# Patient Record
Sex: Female | Born: 1960 | Race: Black or African American | Hispanic: No | Marital: Married | Smoking: Former smoker
Health system: Southern US, Community
[De-identification: ages and names within clinical notes are randomized; demographics above are authoritative.]

## PROBLEM LIST (undated history)

## (undated) DIAGNOSIS — A159 Respiratory tuberculosis unspecified: Secondary | ICD-10-CM

## (undated) DIAGNOSIS — C50919 Malignant neoplasm of unspecified site of unspecified female breast: Secondary | ICD-10-CM

## (undated) DIAGNOSIS — R519 Headache, unspecified: Secondary | ICD-10-CM

## (undated) DIAGNOSIS — R112 Nausea with vomiting, unspecified: Secondary | ICD-10-CM

## (undated) DIAGNOSIS — Z9889 Other specified postprocedural states: Secondary | ICD-10-CM

## (undated) DIAGNOSIS — R51 Headache: Secondary | ICD-10-CM

## (undated) DIAGNOSIS — Z9221 Personal history of antineoplastic chemotherapy: Secondary | ICD-10-CM

## (undated) HISTORY — PX: AUGMENTATION MAMMAPLASTY: SUR837

## (undated) HISTORY — PX: TONSILLECTOMY: SUR1361

---

## 2011-11-23 HISTORY — PX: MASTECTOMY: SHX3

## 2015-12-05 ENCOUNTER — Emergency Department (HOSPITAL_COMMUNITY)
Admission: EM | Admit: 2015-12-05 | Discharge: 2015-12-05 | Disposition: A | Discharge status: Home / Self Care | Payer: Self-pay | Attending: Emergency Medicine | Admitting: Emergency Medicine

## 2015-12-05 ENCOUNTER — Encounter (HOSPITAL_COMMUNITY): Payer: Self-pay | Admitting: *Deleted

## 2015-12-05 DIAGNOSIS — Y9389 Activity, other specified: Secondary | ICD-10-CM | POA: Insufficient documentation

## 2015-12-05 DIAGNOSIS — X58XXXA Exposure to other specified factors, initial encounter: Secondary | ICD-10-CM | POA: Insufficient documentation

## 2015-12-05 DIAGNOSIS — Z880 Allergy status to penicillin: Secondary | ICD-10-CM | POA: Insufficient documentation

## 2015-12-05 DIAGNOSIS — Z853 Personal history of malignant neoplasm of breast: Secondary | ICD-10-CM | POA: Insufficient documentation

## 2015-12-05 DIAGNOSIS — F17210 Nicotine dependence, cigarettes, uncomplicated: Secondary | ICD-10-CM | POA: Insufficient documentation

## 2015-12-05 DIAGNOSIS — Y9289 Other specified places as the place of occurrence of the external cause: Secondary | ICD-10-CM | POA: Insufficient documentation

## 2015-12-05 DIAGNOSIS — Y658 Other specified misadventures during surgical and medical care: Secondary | ICD-10-CM | POA: Insufficient documentation

## 2015-12-05 DIAGNOSIS — T391X1A Poisoning by 4-Aminophenol derivatives, accidental (unintentional), initial encounter: Secondary | ICD-10-CM | POA: Insufficient documentation

## 2015-12-05 DIAGNOSIS — T85848A Pain due to other internal prosthetic devices, implants and grafts, initial encounter: Secondary | ICD-10-CM | POA: Insufficient documentation

## 2015-12-05 DIAGNOSIS — F419 Anxiety disorder, unspecified: Secondary | ICD-10-CM | POA: Insufficient documentation

## 2015-12-05 DIAGNOSIS — Y998 Other external cause status: Secondary | ICD-10-CM | POA: Insufficient documentation

## 2015-12-05 HISTORY — DX: Malignant neoplasm of unspecified site of unspecified female breast: C50.919

## 2015-12-05 LAB — CBC WITH DIFFERENTIAL/PLATELET
BASOS ABS: 0 10*3/uL (ref 0.0–0.1)
Basophils Relative: 0 %
Eosinophils Absolute: 0.1 10*3/uL (ref 0.0–0.7)
Eosinophils Relative: 1 %
HEMATOCRIT: 44.9 % (ref 36.0–46.0)
Hemoglobin: 14.5 g/dL (ref 12.0–15.0)
LYMPHS PCT: 22 %
Lymphs Abs: 1.8 10*3/uL (ref 0.7–4.0)
MCH: 27.7 pg (ref 26.0–34.0)
MCHC: 32.3 g/dL (ref 30.0–36.0)
MCV: 85.7 fL (ref 78.0–100.0)
Monocytes Absolute: 0.8 10*3/uL (ref 0.1–1.0)
Monocytes Relative: 10 %
NEUTROS ABS: 5.6 10*3/uL (ref 1.7–7.7)
Neutrophils Relative %: 67 %
Platelets: 198 10*3/uL (ref 150–400)
RBC: 5.24 MIL/uL — AB (ref 3.87–5.11)
RDW: 12.9 % (ref 11.5–15.5)
WBC: 8.4 10*3/uL (ref 4.0–10.5)

## 2015-12-05 LAB — COMPREHENSIVE METABOLIC PANEL
ALT: 16 U/L (ref 14–54)
AST: 22 U/L (ref 15–41)
Albumin: 4.8 g/dL (ref 3.5–5.0)
Alkaline Phosphatase: 94 U/L (ref 38–126)
Anion gap: 11 (ref 5–15)
BILIRUBIN TOTAL: 1 mg/dL (ref 0.3–1.2)
BUN: 6 mg/dL (ref 6–20)
CALCIUM: 9.4 mg/dL (ref 8.9–10.3)
CO2: 21 mmol/L — AB (ref 22–32)
Chloride: 109 mmol/L (ref 101–111)
Creatinine, Ser: 0.69 mg/dL (ref 0.44–1.00)
GLUCOSE: 76 mg/dL (ref 65–99)
POTASSIUM: 3.4 mmol/L — AB (ref 3.5–5.1)
Sodium: 141 mmol/L (ref 135–145)
Total Protein: 8.4 g/dL — ABNORMAL HIGH (ref 6.5–8.1)

## 2015-12-05 LAB — ACETAMINOPHEN LEVEL: ACETAMINOPHEN (TYLENOL), SERUM: 12 ug/mL (ref 10–30)

## 2015-12-05 LAB — ETHANOL: Alcohol, Ethyl (B): <5 mg/dL (ref <5)

## 2015-12-05 LAB — SALICYLATE LEVEL

## 2015-12-05 LAB — LIPASE, BLOOD: Lipase: 29 U/L (ref 11–51)

## 2015-12-05 MED ORDER — FENTANYL CITRATE (PF) 100 MCG/2ML IJ SOLN
50.0000 ug | Freq: Once | INTRAMUSCULAR | Status: AC
  Administered 2015-12-05: 50 ug via INTRAVENOUS
  Filled 2015-12-05: qty 2

## 2015-12-05 MED ORDER — SODIUM CHLORIDE 0.9 % IV BOLUS (SEPSIS)
1000.0000 mL | Freq: Once | INTRAVENOUS | Status: AC
  Administered 2015-12-05: 1000 mL via INTRAVENOUS

## 2015-12-05 MED ORDER — CLINDAMYCIN HCL 150 MG PO CAPS
300.0000 mg | ORAL_CAPSULE | Freq: Once | ORAL | Status: AC
  Administered 2015-12-05: 300 mg via ORAL
  Filled 2015-12-05: qty 2

## 2015-12-05 MED ORDER — IBUPROFEN 800 MG PO TABS: 800.0000 mg | ORAL_TABLET | Freq: Three times a day (TID) | ORAL | Status: AC

## 2015-12-05 MED ORDER — TRAMADOL HCL 50 MG PO TABS: 50.0000 mg | ORAL_TABLET | Freq: Four times a day (QID) | ORAL | Status: DC | PRN

## 2015-12-05 MED ORDER — CLINDAMYCIN HCL 150 MG PO CAPS: 300.0000 mg | ORAL_CAPSULE | Freq: Three times a day (TID) | ORAL | Status: DC

## 2015-12-05 NOTE — Discharge Instructions (Signed)
As discussed, tonight's results have been largely reassuring. However, it is very important that you establish care with a local primary care physician, and follow-up with a dentist for definitive care of your decayed tooth.  Although there is no evidence for adverse outcomes from your Tylenol use, please use all medication as directed, and do not hesitate to return here if you develop new, or concerning changes in your condition.

## 2015-12-05 NOTE — ED Notes (Signed)
Pt ambulated with assistance to bathroom with steady, even gait.

## 2015-12-05 NOTE — ED Provider Notes (Signed)
CSN: [id number]     Arrival date & time 12/05/15  1707 History   First MD Initiated Contact with Patient 12/05/15 1728     Chief Complaint  Patient presents with  . Drug Overdose     (Consider location/radiation/quality/duration/timing/severity/associated sxs/prior Treatment) HPI Patient presents with her husband who assists with the history of present illness. The patient notes that over the past week she has had persistent pain in the right inferior jaw from a known dental caries. Over the past 24 hours pain has become more severe, and she has tried taking at least 8, possibly more Tylenol tablets, in addition to her husbands antibiotics for relief. She denies concerns beyond dental pain. Husband states that the patient was found minimally responsive by him when he arrived home this afternoon, just prior to ED arrival. There is a Hx of breast CA, but no active disease. Past Medical History  Diagnosis Date  . Breast cancer [facility])    Past Surgical History  Procedure Laterality Date  . Mastectomy Right    No family history on file. Social History  Substance Use Topics  . Smoking status: Current Every Day Smoker -- 0.75 packs/day    Types: Cigarettes  . Smokeless tobacco: None  . Alcohol Use: Yes     Comment: occasinally   OB History    No data available     Review of Systems  Constitutional:       Per HPI, otherwise negative  HENT:       Per HPI, otherwise negative  Respiratory:       Per HPI, otherwise negative  Cardiovascular:       Per HPI, otherwise negative  Gastrointestinal: Negative for vomiting.  Endocrine:       Negative aside from HPI  Genitourinary:       Neg aside from HPI   Musculoskeletal:       Per HPI, otherwise negative  Skin: Negative.   Neurological: Negative for syncope.  Psychiatric/Behavioral: The patient is nervous/anxious.       Allergies  Penicillins  Home Medications   Prior to Admission medications   Medication Sig Start Date  End Date Taking? Authorizing Provider  acetaminophen (TYLENOL) 500 MG tablet Take 500 mg by mouth every 6 (six) hours as needed for mild pain or moderate pain.   Yes Historical Provider, MD   BP 169/100 mmHg  Pulse 87  Temp(Src) 98.7 F (37.1 C) (Oral)  Resp 14  Ht 5\' 7"  (1.702 m)  Wt 150 lb (68.04 kg)  BMI 23.49 kg/m2  SpO2 100% Physical Exam  Constitutional: She is oriented to person, place, and time. She appears well-developed and well-nourished. No distress.  HENT:  Head: Normocephalic and atraumatic.  Mouth/Throat: Uvula is midline and oropharynx is clear and moist.    Eyes: Conjunctivae and EOM are normal.  Cardiovascular: Normal rate and regular rhythm.   Pulmonary/Chest: Effort normal and breath sounds normal. No stridor. No respiratory distress.  Abdominal: She exhibits no distension.  Musculoskeletal: She exhibits no edema.  Neurological: She is alert and oriented to person, place, and time. No cranial nerve deficit.  Skin: Skin is warm and dry.  Psychiatric: Her mood appears anxious.  Nursing note and vitals reviewed.   ED Course  Procedures (including critical care time) Labs Review Labs Reviewed  COMPREHENSIVE METABOLIC PANEL - Abnormal; Notable for the following:    Potassium 3.4 (*)    CO2 21 (*)    Total Protein 8.4 (*)  All other components within normal limits  CBC WITH DIFFERENTIAL/PLATELET - Abnormal; Notable for the following:    RBC 5.24 (*)    All other components within normal limits  ACETAMINOPHEN LEVEL  ETHANOL  LIPASE, BLOOD  SALICYLATE LEVEL    0000000 PM Patient awake, alert, smiling. No new complaint speed I again evaluated her dental issue, there is mild erythema, but no abscess. Patient clarifies that her Tylenol ingestion was greater than 6 hours ago, and with reassuring Tylenol level, no indication for repeat testing. Patient provided resources for dental services  MDM  Patient presents with her husband, and joint concern of  ongoing dental pain, possible overdose. Patient has taken multiple Tylenol tablets, but here there is no evidence for hepatic dysfunction, nor related Tylenol level. Patient's symptoms improved here, and was otherwise reassuring findings, she was discharged in stable condition to follow-up with dental.  Carmin Muskrat, MD 12/05/15 2111

## 2015-12-05 NOTE — ED Notes (Addendum)
Pt brought in by [facility] for call of Tylenol overdose. Pt has been having right lower tooth pain x 1 week. Pt took 4 Keflex today that was her husbands to try and get some antibiotics started. Pt has not seen dentist due to financial reasons. Pt is cancer pt with right mastectomy. Pt reports taking about 10 regular strength Tylenol over the last 24 hours. Pt stood up at home when EMS arrived and pt fell against the wall upon standing. Pt refused any IV sticks from EMS and didn't want to come to ED but eventually decided to come to ED. Pt reports hot flashes and cold chills intermittently. Pt answers appropriately, alert and oriented x 4. Pt does seem slightly jumpy, lethargic, shaky. Pt's husband at bedside reports pt seems "very disoriented" to him.

## 2017-05-30 ENCOUNTER — Inpatient Hospital Stay (HOSPITAL_COMMUNITY)
Admission: EM | Admit: 2017-05-30 | Discharge: 2017-06-01 | DRG: 982 | Disposition: A | Discharge status: Home / Self Care | Payer: No Typology Code available for payment source | Attending: General Surgery | Admitting: General Surgery

## 2017-05-30 ENCOUNTER — Emergency Department (HOSPITAL_COMMUNITY): Payer: No Typology Code available for payment source

## 2017-05-30 ENCOUNTER — Encounter (HOSPITAL_COMMUNITY): Payer: Self-pay | Admitting: Radiology

## 2017-05-30 ENCOUNTER — Inpatient Hospital Stay (HOSPITAL_COMMUNITY): Payer: No Typology Code available for payment source | Admitting: Anesthesiology

## 2017-05-30 ENCOUNTER — Inpatient Hospital Stay (HOSPITAL_COMMUNITY): Payer: No Typology Code available for payment source

## 2017-05-30 ENCOUNTER — Encounter (HOSPITAL_COMMUNITY): Admission: EM | Disposition: A | Discharge status: Home / Self Care | Payer: Self-pay | Source: Home / Self Care

## 2017-05-30 DIAGNOSIS — S159XXA Injury of unspecified blood vessel at neck level, initial encounter: Secondary | ICD-10-CM | POA: Diagnosis present

## 2017-05-30 DIAGNOSIS — Z853 Personal history of malignant neoplasm of breast: Secondary | ICD-10-CM

## 2017-05-30 DIAGNOSIS — N6320 Unspecified lump in the left breast, unspecified quadrant: Secondary | ICD-10-CM

## 2017-05-30 DIAGNOSIS — F1210 Cannabis abuse, uncomplicated: Secondary | ICD-10-CM | POA: Diagnosis present

## 2017-05-30 DIAGNOSIS — F17210 Nicotine dependence, cigarettes, uncomplicated: Secondary | ICD-10-CM | POA: Diagnosis present

## 2017-05-30 DIAGNOSIS — I639 Cerebral infarction, unspecified: Secondary | ICD-10-CM

## 2017-05-30 DIAGNOSIS — Z9011 Acquired absence of right breast and nipple: Secondary | ICD-10-CM

## 2017-05-30 DIAGNOSIS — M542 Cervicalgia: Secondary | ICD-10-CM | POA: Diagnosis present

## 2017-05-30 DIAGNOSIS — S20319A Abrasion of unspecified front wall of thorax, initial encounter: Secondary | ICD-10-CM | POA: Diagnosis present

## 2017-05-30 DIAGNOSIS — Y92410 Unspecified street and highway as the place of occurrence of the external cause: Secondary | ICD-10-CM | POA: Diagnosis not present

## 2017-05-30 DIAGNOSIS — S1093XA Contusion of unspecified part of neck, initial encounter: Secondary | ICD-10-CM

## 2017-05-30 DIAGNOSIS — Z9221 Personal history of antineoplastic chemotherapy: Secondary | ICD-10-CM | POA: Diagnosis not present

## 2017-05-30 DIAGNOSIS — I493 Ventricular premature depolarization: Secondary | ICD-10-CM | POA: Diagnosis present

## 2017-05-30 DIAGNOSIS — S1083XA Contusion of other specified part of neck, initial encounter: Principal | ICD-10-CM | POA: Diagnosis present

## 2017-05-30 HISTORY — PX: IR ANGIOGRAM SELECTIVE EACH ADDITIONAL VESSEL: IMG667

## 2017-05-30 HISTORY — PX: IR ANGIO EXTRACRAN SEL COM CAROTID INNOMINATE UNI L MOD SED: IMG5355

## 2017-05-30 HISTORY — PX: IR ANGIOGRAM EXTREMITY LEFT: IMG651

## 2017-05-30 HISTORY — PX: IR EMBO ART  VEN HEMORR LYMPH EXTRAV  INC GUIDE ROADMAPPING: IMG5450

## 2017-05-30 HISTORY — PX: IR ANGIO INTRA EXTRACRAN SEL COM CAROTID INNOMINATE UNI R MOD SED: IMG5359

## 2017-05-30 HISTORY — PX: IR ANGIO VERTEBRAL SEL SUBCLAVIAN INNOMINATE BILAT MOD SED: IMG5366

## 2017-05-30 HISTORY — PX: RADIOLOGY WITH ANESTHESIA: SHX6223

## 2017-05-30 LAB — CBC WITH DIFFERENTIAL/PLATELET
Basophils Absolute: 0 10*3/uL (ref 0.0–0.1)
Basophils Relative: 0 %
Eosinophils Absolute: 0.3 10*3/uL (ref 0.0–0.7)
Eosinophils Relative: 2 %
HCT: 44.4 % (ref 36.0–46.0)
Hemoglobin: 14.6 g/dL (ref 12.0–15.0)
Lymphocytes Relative: 14 %
Lymphs Abs: 2.5 10*3/uL (ref 0.7–4.0)
MCH: 28 pg (ref 26.0–34.0)
MCHC: 32.9 g/dL (ref 30.0–36.0)
MCV: 85.2 fL (ref 78.0–100.0)
Monocytes Absolute: 1 10*3/uL (ref 0.1–1.0)
Monocytes Relative: 6 %
Neutro Abs: 13.9 10*3/uL — ABNORMAL HIGH (ref 1.7–7.7)
Neutrophils Relative %: 78 %
Platelets: 275 10*3/uL (ref 150–400)
RBC: 5.21 MIL/uL — ABNORMAL HIGH (ref 3.87–5.11)
RDW: 13.7 % (ref 11.5–15.5)
WBC: 17.6 10*3/uL — ABNORMAL HIGH (ref 4.0–10.5)

## 2017-05-30 LAB — BASIC METABOLIC PANEL
Anion gap: 10 (ref 5–15)
BUN: <5 mg/dL — ABNORMAL LOW (ref 6–20)
CO2: 25 mmol/L (ref 22–32)
Calcium: 9.4 mg/dL (ref 8.9–10.3)
Chloride: 105 mmol/L (ref 101–111)
Creatinine, Ser: 0.72 mg/dL (ref 0.44–1.00)
GFR calc Af Amer: >60 mL/min (ref >60)
GFR calc non Af Amer: >60 mL/min (ref >60)
Glucose, Bld: 92 mg/dL (ref 65–99)
Potassium: 3.4 mmol/L — ABNORMAL LOW (ref 3.5–5.1)
Sodium: 140 mmol/L (ref 135–145)

## 2017-05-30 LAB — PROTIME-INR
INR: 1.07
PROTHROMBIN TIME: 14 s (ref 11.4–15.2)

## 2017-05-30 SURGERY — RADIOLOGY WITH ANESTHESIA
Anesthesia: General

## 2017-05-30 MED ORDER — VANCOMYCIN HCL IN DEXTROSE 1-5 GM/200ML-% IV SOLN
INTRAVENOUS | Status: AC
  Filled 2017-05-30: qty 200

## 2017-05-30 MED ORDER — ONDANSETRON 4 MG PO TBDP: 4.0000 mg | ORAL_TABLET | Freq: Four times a day (QID) | ORAL | Status: DC | PRN

## 2017-05-30 MED ORDER — LORAZEPAM 2 MG/ML IJ SOLN
1.0000 mg | Freq: Once | INTRAMUSCULAR | Status: AC
  Administered 2017-05-30: 1 mg via INTRAVENOUS
  Filled 2017-05-30: qty 1

## 2017-05-30 MED ORDER — LACTATED RINGERS IV SOLN
INTRAVENOUS | Status: DC | PRN
  Administered 2017-05-30: 20:00:00 via INTRAVENOUS

## 2017-05-30 MED ORDER — METHOCARBAMOL 1000 MG/10ML IJ SOLN
500.0000 mg | Freq: Three times a day (TID) | INTRAVENOUS | Status: DC | PRN
  Filled 2017-05-30: qty 5

## 2017-05-30 MED ORDER — FENTANYL CITRATE (PF) 250 MCG/5ML IJ SOLN
INTRAMUSCULAR | Status: DC | PRN
  Administered 2017-05-30: 100 ug via INTRAVENOUS

## 2017-05-30 MED ORDER — ACETAMINOPHEN 325 MG PO TABS: 650.0000 mg | ORAL_TABLET | Freq: Four times a day (QID) | ORAL | Status: DC | PRN

## 2017-05-30 MED ORDER — IOPAMIDOL (ISOVUE-300) INJECTION 61%
INTRAVENOUS | Status: AC
  Administered 2017-05-30: 90 mL
  Filled 2017-05-30: qty 150

## 2017-05-30 MED ORDER — SODIUM CHLORIDE 0.9 % IV SOLN
INTRAVENOUS | Status: DC
  Administered 2017-05-31: 05:00:00 via INTRAVENOUS

## 2017-05-30 MED ORDER — FENTANYL CITRATE (PF) 100 MCG/2ML IJ SOLN
INTRAMUSCULAR | Status: AC
  Filled 2017-05-30: qty 2

## 2017-05-30 MED ORDER — PROPOFOL 10 MG/ML IV BOLUS
INTRAVENOUS | Status: DC | PRN
  Administered 2017-05-30: 20 mg via INTRAVENOUS
  Administered 2017-05-30: 140 mg via INTRAVENOUS

## 2017-05-30 MED ORDER — ACETAMINOPHEN 325 MG PO TABS: 650.0000 mg | ORAL_TABLET | ORAL | Status: DC | PRN

## 2017-05-30 MED ORDER — FENTANYL CITRATE (PF) 100 MCG/2ML IJ SOLN: 25.0000 ug | INTRAMUSCULAR | Status: DC | PRN

## 2017-05-30 MED ORDER — SODIUM CHLORIDE 0.9 % IV BOLUS (SEPSIS)
1000.0000 mL | Freq: Once | INTRAVENOUS | Status: AC
  Administered 2017-05-30: 1000 mL via INTRAVENOUS

## 2017-05-30 MED ORDER — MORPHINE SULFATE (PF) 4 MG/ML IV SOLN
2.0000 mg | INTRAVENOUS | Status: DC | PRN
  Administered 2017-05-31: 2 mg via INTRAVENOUS
  Filled 2017-05-30: qty 1

## 2017-05-30 MED ORDER — PHENYLEPHRINE HCL 10 MG/ML IJ SOLN
INTRAMUSCULAR | Status: DC | PRN
  Administered 2017-05-30 (×3): 40 ug via INTRAVENOUS

## 2017-05-30 MED ORDER — MIDAZOLAM HCL 2 MG/2ML IJ SOLN
INTRAMUSCULAR | Status: AC
  Filled 2017-05-30: qty 4

## 2017-05-30 MED ORDER — ONDANSETRON HCL 4 MG/2ML IJ SOLN
4.0000 mg | Freq: Four times a day (QID) | INTRAMUSCULAR | Status: DC | PRN
Start: 2017-05-30 — End: 2017-06-01
  Administered 2017-05-31: 4 mg via INTRAVENOUS
  Filled 2017-05-30: qty 2

## 2017-05-30 MED ORDER — ACETAMINOPHEN 650 MG RE SUPP: 650.0000 mg | RECTAL | Status: DC | PRN

## 2017-05-30 MED ORDER — HYDRALAZINE HCL 20 MG/ML IJ SOLN: 20.0000 mg | INTRAMUSCULAR | Status: DC | PRN

## 2017-05-30 MED ORDER — IOPAMIDOL (ISOVUE-370) INJECTION 76%
INTRAVENOUS | Status: AC
  Administered 2017-05-30: 100 mL
  Filled 2017-05-30: qty 100

## 2017-05-30 MED ORDER — ONDANSETRON HCL 4 MG/2ML IJ SOLN
INTRAMUSCULAR | Status: DC | PRN
  Administered 2017-05-30: 4 mg via INTRAVENOUS

## 2017-05-30 MED ORDER — NITROGLYCERIN 1 MG/10 ML FOR IR/CATH LAB
INTRA_ARTERIAL | Status: AC
  Filled 2017-05-30: qty 10

## 2017-05-30 MED ORDER — HEPARIN SODIUM (PORCINE) 1000 UNIT/ML IJ SOLN
INTRAMUSCULAR | Status: DC | PRN
  Administered 2017-05-30 (×2): 1000 [IU] via INTRAVENOUS

## 2017-05-30 MED ORDER — HEPARIN SODIUM (PORCINE) 1000 UNIT/ML IJ SOLN
INTRAMUSCULAR | Status: AC
  Filled 2017-05-30: qty 1

## 2017-05-30 MED ORDER — ONDANSETRON HCL 4 MG/2ML IJ SOLN: 4.0000 mg | Freq: Four times a day (QID) | INTRAMUSCULAR | Status: DC | PRN

## 2017-05-30 MED ORDER — IOPAMIDOL (ISOVUE-300) INJECTION 61%
INTRAVENOUS | Status: AC
  Administered 2017-05-30: 20 mL
  Filled 2017-05-30: qty 150

## 2017-05-30 MED ORDER — SODIUM CHLORIDE 0.9 % IV SOLN: INTRAVENOUS | Status: DC

## 2017-05-30 MED ORDER — SUCCINYLCHOLINE CHLORIDE 20 MG/ML IJ SOLN
INTRAMUSCULAR | Status: DC | PRN
  Administered 2017-05-30: 100 mg via INTRAVENOUS

## 2017-05-30 MED ORDER — ROCURONIUM BROMIDE 100 MG/10ML IV SOLN
INTRAVENOUS | Status: DC | PRN
  Administered 2017-05-30: 30 mg via INTRAVENOUS

## 2017-05-30 MED ORDER — ESMOLOL HCL 100 MG/10ML IV SOLN
INTRAVENOUS | Status: DC | PRN
  Administered 2017-05-30: 30 mg via INTRAVENOUS

## 2017-05-30 MED ORDER — MORPHINE SULFATE (PF) 4 MG/ML IV SOLN
4.0000 mg | Freq: Once | INTRAVENOUS | Status: AC
  Administered 2017-05-30: 4 mg via INTRAVENOUS
  Filled 2017-05-30: qty 1

## 2017-05-30 MED ORDER — ACETAMINOPHEN 650 MG RE SUPP: 650.0000 mg | Freq: Four times a day (QID) | RECTAL | Status: DC | PRN

## 2017-05-30 MED ORDER — VANCOMYCIN HCL 1000 MG IV SOLR
INTRAVENOUS | Status: DC | PRN
  Administered 2017-05-30: 1000 mg via INTRAVENOUS

## 2017-05-30 MED ORDER — ONDANSETRON HCL 4 MG/2ML IJ SOLN
4.0000 mg | Freq: Once | INTRAMUSCULAR | Status: AC
  Administered 2017-05-30: 4 mg via INTRAVENOUS
  Filled 2017-05-30: qty 2

## 2017-05-30 MED ORDER — LIDOCAINE HCL (CARDIAC) 20 MG/ML IV SOLN
INTRAVENOUS | Status: DC | PRN
  Administered 2017-05-30: 60 mg via INTRATRACHEAL

## 2017-05-30 MED ORDER — ACETAMINOPHEN 160 MG/5ML PO SOLN: 650.0000 mg | ORAL | Status: DC | PRN

## 2017-05-30 MED ORDER — SUGAMMADEX SODIUM 200 MG/2ML IV SOLN
INTRAVENOUS | Status: DC | PRN
  Administered 2017-05-30: 200 mg via INTRAVENOUS

## 2017-05-30 NOTE — Anesthesia Procedure Notes (Signed)
Procedure Name: Intubation Date/Time: 05/30/2017 8:40 PM Performed by: Maude Leriche D Pre-anesthesia Checklist: Patient identified, Emergency Drugs available, Suction available, Patient being monitored and Timeout performed Patient Re-evaluated:Patient Re-evaluated prior to inductionOxygen Delivery Method: Circle system utilized Preoxygenation: Pre-oxygenation with 100% oxygen Intubation Type: IV induction, Rapid sequence and Cricoid Pressure applied Laryngoscope Size: Miller and 2 Grade View: Grade I Tube type: Subglottic suction tube Tube size: 7.5 mm Number of attempts: 1 Airway Equipment and Method: Stylet Placement Confirmation: ETT inserted through vocal cords under direct vision,  positive ETCO2 and breath sounds checked- equal and bilateral Secured at: 22 cm Tube secured with: Tape Dental Injury: Teeth and Oropharynx as per pre-operative assessment

## 2017-05-30 NOTE — Procedures (Signed)
S/P Lt subclavian arteriogram,and endovascular  embolization of transverse cervical branch of Lt thyrocervical trunk with PVA particles.

## 2017-05-30 NOTE — Sedation Documentation (Signed)
6Fr sheath removed for R femoral artery by K Hines, RTR. Hemostasis achieved with Exoseal closure device and manual pressure X10 mins. Groin level 0. Gauze/Tegaderm bandage applied, CDI.

## 2017-05-30 NOTE — Sedation Documentation (Signed)
Anesthesia case 

## 2017-05-30 NOTE — Progress Notes (Signed)
1 pair of black pants, underwear, pair of socks, 1 yellow colored ring with blue stones, 1 yellow colored ring with purple and clear stones, 2 white colored bracelets with clear stones given to spouse. Jewelry was placed in a sealed plastic bag and folded over with patient label placed on bag. Items were handed off to spouse by Babs Bertin RR RN and Margaretmary Dys IR RN

## 2017-05-30 NOTE — ED Notes (Signed)
Assisted pt to the bathroom

## 2017-05-30 NOTE — Anesthesia Preprocedure Evaluation (Addendum)
Anesthesia Evaluation  Patient identified by MRN, date of birth, ID band Patient awake    Reviewed: Allergy & Precautions, H&P , NPO status , Patient's Chart, lab work & pertinent test results  Airway Mallampati: II  TM Distance: >3 FB Neck ROM: Limited    Dental no notable dental hx. (+) Teeth Intact, Dental Advisory Given   Pulmonary Current Smoker,    Pulmonary exam normal breath sounds clear to auscultation       Cardiovascular negative cardio ROS   Rhythm:Regular Rate:Normal     Neuro/Psych negative neurological ROS  negative psych ROS   GI/Hepatic negative GI ROS, (+)     substance abuse  marijuana use,   Endo/Other  negative endocrine ROS  Renal/GU negative Renal ROS  negative genitourinary   Musculoskeletal   Abdominal   Peds  Hematology negative hematology ROS (+)   Anesthesia Other Findings   Reproductive/Obstetrics negative OB ROS                           Anesthesia Physical Anesthesia Plan  ASA: II and emergent  Anesthesia Plan: General   Post-op Pain Management:    Induction: Intravenous, Rapid sequence and Cricoid pressure planned  PONV Risk Score and Plan: 3 and Ondansetron, Dexamethasone and Midazolam  Airway Management Planned: Oral ETT  Additional Equipment:   Intra-op Plan:   Post-operative Plan: Extubation in OR and Possible Post-op intubation/ventilation  Informed Consent: I have reviewed the patients History and Physical, chart, labs and discussed the procedure including the risks, benefits and alternatives for the proposed anesthesia with the patient or authorized representative who has indicated his/her understanding and acceptance.   Dental advisory given and Only emergency history available  Plan Discussed with: CRNA, Anesthesiologist and Surgeon  Anesthesia Plan Comments:       Anesthesia Quick Evaluation

## 2017-05-30 NOTE — ED Notes (Signed)
Patient refusing IV start and lab work

## 2017-05-30 NOTE — Transfer of Care (Signed)
Immediate Anesthesia Transfer of Care Note  Patient: Suzanne May  Procedure(s) Performed: Procedure(s): RADIOLOGY WITH ANESTHESIA (N/A)  Patient Location: PACU  Anesthesia Type:General  Level of Consciousness: drowsy  Airway & Oxygen Therapy: Patient Spontanous Breathing and Patient connected to face mask oxygen  Post-op Assessment: Report given to RN and Post -op Vital signs reviewed and stable  Post vital signs: Reviewed and stable  Last Vitals:  Vitals:   05/30/17 1405 05/30/17 2231  BP: (!) 196/128 (!) 169/110  Pulse: 86 73  Resp: 20 17  Temp: 36.6 C (!) 36.1 C    Last Pain:  Vitals:   05/30/17 2231  TempSrc:   PainSc: Asleep         Complications: No apparent anesthesia complications

## 2017-05-30 NOTE — Progress Notes (Signed)
Report given to Elta Guadeloupe, RN on 4N.  RT came to the PACU looking for the patient for family in the waiting area and will send them to the 4N waiting area.
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
## 2017-05-30 NOTE — Sedation Documentation (Signed)
Report given to Clovis Cao, RN at bedside. Groin level 0, drsg CDI pulses per flow sheet.

## 2017-05-30 NOTE — Sedation Documentation (Signed)
Belonging given to husband. Witnessed by Ubaldo Glassing, RN.

## 2017-05-30 NOTE — ED Notes (Signed)
Patient taken to IR

## 2017-05-30 NOTE — Sedation Documentation (Signed)
Pt transported to PACU for recovery on stretcher.
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
## 2017-05-31 ENCOUNTER — Encounter (HOSPITAL_COMMUNITY): Payer: Self-pay | Admitting: Neurology

## 2017-05-31 LAB — POCT ACTIVATED CLOTTING TIME: ACTIVATED CLOTTING TIME: 147 s

## 2017-05-31 LAB — MRSA PCR SCREENING: MRSA BY PCR: NEGATIVE

## 2017-05-31 MED ORDER — MORPHINE SULFATE (PF) 4 MG/ML IV SOLN
2.0000 mg | INTRAVENOUS | Status: DC | PRN
  Administered 2017-05-31: 2 mg via INTRAVENOUS
  Filled 2017-05-31: qty 1

## 2017-05-31 MED ORDER — MORPHINE SULFATE (PF) 4 MG/ML IV SOLN
INTRAVENOUS | Status: AC
  Administered 2017-05-31: 2 mg
  Filled 2017-05-31: qty 1

## 2017-05-31 MED ORDER — OXYCODONE HCL 5 MG PO TABS: 5.0000 mg | ORAL_TABLET | ORAL | Status: DC | PRN

## 2017-05-31 MED ORDER — TRAMADOL HCL 50 MG PO TABS: 50.0000 mg | ORAL_TABLET | Freq: Four times a day (QID) | ORAL | Status: DC | PRN

## 2017-05-31 MED ORDER — ACETAMINOPHEN 500 MG PO TABS
1000.0000 mg | ORAL_TABLET | Freq: Three times a day (TID) | ORAL | Status: DC
  Administered 2017-05-31 – 2017-06-01 (×3): 1000 mg via ORAL
  Filled 2017-05-31 (×3): qty 2
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
## 2017-05-31 NOTE — Anesthesia Postprocedure Evaluation (Signed)
Anesthesia Post Note  Patient: Suzanne May  Procedure(s) Performed: Procedure(s) (LRB): RADIOLOGY WITH ANESTHESIA (N/A)     Patient location during evaluation: PACU Anesthesia Type: General Level of consciousness: awake and alert Pain management: pain level controlled Vital Signs Assessment: post-procedure vital signs reviewed and stable Respiratory status: spontaneous breathing, nonlabored ventilation, respiratory function stable and patient connected to nasal cannula oxygen Cardiovascular status: blood pressure returned to baseline and stable Postop Assessment: no signs of nausea or vomiting Anesthetic complications: no    Last Vitals:  Vitals:   05/31/17 0600 05/31/17 0700  BP: (!) 161/99 (!) 172/93  Pulse: 68 82  Resp: 16 19  Temp:      Last Pain:  Vitals:   05/31/17 0400  TempSrc: Oral  PainSc: Asleep                 Vishaal Strollo,W. EDMOND
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
## 2017-05-31 NOTE — Progress Notes (Signed)
Pt refused Blood Draw at this time. Pt educated about need for blood draw. She agrees to have blood drawn around 5-6am.

## 2017-05-31 NOTE — Progress Notes (Signed)
Suzanne May refused her lab draws again this morning. I educated pt about reason for labs after her refusal.

## 2017-05-31 NOTE — Progress Notes (Signed)
Patient refused blood work overnight. MD Wyatt notified during morning rounds. MD Hulen Skains educated patient about importance of bloodwork. Patient agreed to have blood drawn. When Phlebotomy tech arrived to draw labs patient refused again. Nurse spoke with patient and educated on the importance of lab work while in the hospital, but patient refused.

## 2017-05-31 NOTE — Progress Notes (Signed)
Received pt from 4E via bed. A&O x4, pleasant and cooperative.  Medicated for left neck and shoulder pain.

## 2017-06-01 MED ORDER — TRAMADOL HCL 50 MG PO TABS: 50.0000 mg | ORAL_TABLET | Freq: Four times a day (QID) | ORAL | 0 refills | Status: DC | PRN

## 2017-06-01 NOTE — Progress Notes (Signed)
Pt refused blood draw  again this morning.
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
## 2017-06-01 NOTE — Discharge Instructions (Signed)
1. PAIN CONTROL:  1. Pain is best controlled by a usual combination of three different methods TOGETHER:  1. Ice/Heat 2. Over the counter pain medication 3. Prescription pain medication 2. Ice packs or heating pads (30-60 minutes up to 6 times a day) will help. Use ice for the first few days to help decrease swelling and bruising, then switch to heat to help relax tight/sore spots and speed recovery. Some people prefer to use ice alone, heat alone, alternating between ice & heat. Experiment to what works for you. Swelling and bruising can take several weeks to resolve.  3. It is helpful to take an over-the-counter pain medication regularly for the first few weeks. Choose one of the following that works best for you:  1. Naproxen (Aleve, etc) Two 220mg  tabs twice a day 2. Ibuprofen (Advil, etc) Three 200mg  tabs four times a day (every meal & bedtime) 3. Acetaminophen (Tylenol, etc) 500-650mg  four times a day (every meal & bedtime) 4. A prescription for pain medication (such as oxycodone, hydrocodone, etc) should be given to you upon discharge. Take your pain medication as prescribed.  1. If you are having problems/concerns with the prescription medicine (does not control pain, nausea, vomiting, rash, itching, etc), please call us [phone] to see if we need to switch you to a different pain medicine that will work better for you and/or control your side effect better. 2. If you need a refill on your pain medication, please contact your pharmacy. They will contact our office to request authorization. Prescriptions will not be filled after 5 pm or on week-ends. 4. Avoid getting constipated. When taking pain medications, it is common to experience some constipation. Increasing fluid intake and taking a fiber supplement (such as Metamucil, Citrucel, FiberCon, MiraLax, etc) 1-2 times a day regularly will usually help prevent this problem from occurring. A mild laxative (prune juice, Milk of Magnesia,  MiraLax, etc) should be taken according to package directions if there are no bowel movements after 48 hours.  5. Watch out for diarrhea. If you have many loose bowel movements, simplify your diet to bland foods & liquids for a few days. Stop any stool softeners and decrease your fiber supplement. Switching to mild anti-diarrheal medications (Kayopectate, Pepto Bismol) can help. If this worsens or does not improve, please call us. 6. FOLLOW UP in our office  1. Please call [facility] at [phone] to set up an appointment for a follow-up appointment approximately 2-3 weeks after discharge for wound check  WHEN TO CALL us [phone]:  1. Poor pain control 2. Reactions / problems with new medications (rash/itching, nausea, etc)  3. Fever over 101.5 F (38.5 C) 4. Worsening swelling or bruising 5. Difficulty breathing or swallowing  The clinic staff is available to answer your questions during regular business hours (8:30am-5pm). Please dont hesitate to call and ask to speak to one of our nurses for clinical concerns.  If you have a medical emergency, go to the nearest emergency room or call 911.  A surgeon from [facility] is always on call at the [facility], [place], [place], [place], [place] [address] ?  MAIN: [phone] ? TOLL FREE: [phone] ?  FAX [phone]) [phone]  [email or web address]
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
## 2017-06-02 ENCOUNTER — Telehealth (HOSPITAL_COMMUNITY): Payer: Self-pay | Admitting: *Deleted

## 2017-06-02 ENCOUNTER — Encounter (HOSPITAL_COMMUNITY): Payer: Self-pay | Admitting: *Deleted

## 2017-06-02 NOTE — Telephone Encounter (Signed)
Telephoned patient at home number and no answer.

## 2017-06-03 ENCOUNTER — Encounter (HOSPITAL_COMMUNITY): Payer: Self-pay | Admitting: Interventional Radiology

## 2017-06-09 ENCOUNTER — Encounter (INDEPENDENT_AMBULATORY_CARE_PROVIDER_SITE_OTHER): Payer: Self-pay | Admitting: Physician Assistant

## 2017-06-15 ENCOUNTER — Telehealth (HOSPITAL_COMMUNITY): Payer: Self-pay | Admitting: *Deleted

## 2017-06-15 NOTE — Telephone Encounter (Signed)
Telephoned patient at home number no answer.

## 2017-07-04 ENCOUNTER — Other Ambulatory Visit: Payer: Self-pay | Admitting: General Surgery

## 2017-07-04 DIAGNOSIS — N63 Unspecified lump in unspecified breast: Secondary | ICD-10-CM

## 2017-08-11 ENCOUNTER — Other Ambulatory Visit: Payer: Self-pay | Admitting: General Surgery

## 2017-08-11 DIAGNOSIS — Z9011 Acquired absence of right breast and nipple: Secondary | ICD-10-CM

## 2017-08-11 DIAGNOSIS — N63 Unspecified lump in unspecified breast: Secondary | ICD-10-CM

## 2017-08-22 ENCOUNTER — Other Ambulatory Visit: Payer: Self-pay

## 2017-08-24 ENCOUNTER — Ambulatory Visit
Admission: RE | Admit: 2017-08-24 | Discharge: 2017-08-24 | Disposition: A | Discharge status: Home / Self Care | Payer: No Typology Code available for payment source | Source: Ambulatory Visit | Attending: General Surgery | Admitting: General Surgery

## 2017-08-24 ENCOUNTER — Other Ambulatory Visit: Payer: Self-pay | Admitting: General Surgery

## 2017-08-24 ENCOUNTER — Ambulatory Visit: Payer: Self-pay

## 2017-08-24 DIAGNOSIS — N63 Unspecified lump in unspecified breast: Secondary | ICD-10-CM

## 2017-08-24 DIAGNOSIS — Z9011 Acquired absence of right breast and nipple: Secondary | ICD-10-CM

## 2017-08-24 DIAGNOSIS — R599 Enlarged lymph nodes, unspecified: Secondary | ICD-10-CM

## 2017-08-24 HISTORY — DX: Personal history of antineoplastic chemotherapy: Z92.21

## 2017-08-29 ENCOUNTER — Ambulatory Visit
Admission: RE | Admit: 2017-08-29 | Discharge: 2017-08-29 | Disposition: A | Discharge status: Home / Self Care | Payer: No Typology Code available for payment source | Source: Ambulatory Visit | Attending: General Surgery | Admitting: General Surgery

## 2017-08-29 ENCOUNTER — Other Ambulatory Visit: Payer: Self-pay | Admitting: General Surgery

## 2017-08-29 DIAGNOSIS — N63 Unspecified lump in unspecified breast: Secondary | ICD-10-CM

## 2017-08-29 DIAGNOSIS — R599 Enlarged lymph nodes, unspecified: Secondary | ICD-10-CM

## 2017-09-01 ENCOUNTER — Other Ambulatory Visit (HOSPITAL_COMMUNITY): Payer: Self-pay | Admitting: General Surgery

## 2017-09-01 ENCOUNTER — Other Ambulatory Visit: Payer: Self-pay | Admitting: General Surgery

## 2017-09-01 DIAGNOSIS — C50512 Malignant neoplasm of lower-outer quadrant of left female breast: Secondary | ICD-10-CM
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
## 2017-09-06 ENCOUNTER — Ambulatory Visit (HOSPITAL_BASED_OUTPATIENT_CLINIC_OR_DEPARTMENT_OTHER): Payer: Self-pay | Admitting: Hematology and Oncology

## 2017-09-06 VITALS — BP 144/99 | HR 81 | Temp 98.3°F | Resp 20 | Ht 67.0 in | Wt 179.2 lb

## 2017-09-06 DIAGNOSIS — Z171 Estrogen receptor negative status [ER-]: Secondary | ICD-10-CM

## 2017-09-06 DIAGNOSIS — Z720 Tobacco use: Secondary | ICD-10-CM

## 2017-09-06 DIAGNOSIS — C773 Secondary and unspecified malignant neoplasm of axilla and upper limb lymph nodes: Secondary | ICD-10-CM

## 2017-09-06 DIAGNOSIS — Z853 Personal history of malignant neoplasm of breast: Secondary | ICD-10-CM

## 2017-09-06 DIAGNOSIS — C50512 Malignant neoplasm of lower-outer quadrant of left female breast: Secondary | ICD-10-CM

## 2017-09-06 DIAGNOSIS — Z803 Family history of malignant neoplasm of breast: Secondary | ICD-10-CM

## 2017-09-06 MED ORDER — PROCHLORPERAZINE MALEATE 10 MG PO TABS: 10.0000 mg | ORAL_TABLET | Freq: Four times a day (QID) | ORAL | 1 refills | Status: DC | PRN

## 2017-09-06 MED ORDER — LIDOCAINE-PRILOCAINE 2.5-2.5 % EX CREA: TOPICAL_CREAM | CUTANEOUS | 3 refills | Status: DC

## 2017-09-06 MED ORDER — ONDANSETRON HCL 8 MG PO TABS: 8.0000 mg | ORAL_TABLET | Freq: Two times a day (BID) | ORAL | 1 refills | Status: DC | PRN

## 2017-09-06 NOTE — Progress Notes (Signed)
[place] CONSULT NOTE  Patient Care Team: Patient, No Pcp Per as PCP - General (General Practice)  CHIEF COMPLAINTS/PURPOSE OF CONSULTATION:  Newly diagnosed breast cancer  HISTORY OF PRESENTING ILLNESS:  Suzanne May 56 y.o. female is here because of recent diagnosis of left-sided breast cancer. Originally she had a breast cancer in the right side when she underwent mastectomy followed by adjuvant chemotherapy. It was ER/PR positive HER-2 negative. She had 35 or 37 lymph nodes positive. She did not undergo radiation or tamoxifen at that time. She tells me was financial concerns that prevented her from getting tamoxifen.It appears that she may have received Adriamycin for certain along with taxanes. 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 She felt a lump in theleft breast as well as a left axilla. This was evaluated by mammogram and ultrasound and was identified to have a 2.7 cm mass and enlarged left axillary lymph node measuring 3.5 cm. There are 3 adjacent lymph nodes in total. Biopsy of both the mass and the lymph node came back as invasive ductal carcinomagrade 3 that was triple negative with a Ki-67 of 95%. Dr. Barry Dienes saw the patient and schedule her for some CT scans and a port placement but stopping this Thursday. It appears that the tumor is slowly increasing in size. Patient is here accompanied by her husband and wants to get started with the chemotherapy as soon as possible.  I reviewed her records extensively and collaborated the history with the patient.  SUMMARY OF ONCOLOGIC HISTORY:   Malignant neoplasm of lower-outer quadrant of left breast of female, estrogen receptor negative ([place])   2003 Initial Biopsy    Right breast cancer diagnosed and treated at [facility] hormone receptor positive, 30 positive axillary lymph nodes  modified radical mastectomy followed by chemotherapy, did not take radiation on antiestrogen therapy      08/29/2017 Initial Diagnosis    Left breast 5:00 position: 2.7 cm mass, left axillary LN 3.5 cm, adjacent lymph node 0.9 cm, third lymph node 1.1 cm; Biopsy IDC grade 3 with DCIS, lymph node biopsy positive for metastatic ductal carcinoma, ER 0%, PR 0%,HER-2 negative ratio 1.28, stage IIIB AJCC 8       MEDICAL HISTORY:  Past Medical History:  Diagnosis Date  . Breast cancer ([place])   . Personal history of chemotherapy     SURGICAL HISTORY: Past Surgical History:  Procedure Laterality Date  . AUGMENTATION MAMMAPLASTY    . IR ANGIO EXTRACRAN SEL COM CAROTID INNOMINATE UNI L MOD SED  05/30/2017  . IR ANGIO INTRA EXTRACRAN SEL COM CAROTID INNOMINATE UNI R MOD SED  05/30/2017  . IR ANGIO VERTEBRAL SEL SUBCLAVIAN INNOMINATE BILAT MOD SED  05/30/2017  . IR ANGIOGRAM EXTREMITY LEFT  05/30/2017  . IR ANGIOGRAM SELECTIVE EACH ADDITIONAL VESSEL  05/30/2017  . IR EMBO ART  VEN HEMORR LYMPH EXTRAV  INC GUIDE ROADMAPPING  05/30/2017  . MASTECTOMY Right   . RADIOLOGY WITH ANESTHESIA N/A 05/30/2017   Procedure: RADIOLOGY WITH ANESTHESIA;  Surgeon: Luanne Bras, MD;  Location: [facility];  Service: Radiology;  Laterality: N/A;    SOCIAL HISTORY: Social History   Social History  . Marital status: Married    Spouse name: N/A  . Number of children: N/A  . Years of education: N/A   Occupational History  . Not on file.  Social History Main Topics  . Smoking status: Current Every Day Smoker    Packs/day: 0.75    Types: Cigarettes  . Smokeless tobacco: Current User  . Alcohol use Yes     Comment: occasinally  . 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 Sexual activity: Not on file   Other Topics Concern  . Not on file   Social History Narrative  . No narrative on file    FAMILY HISTORY: Family History  Problem Relation Age of Onset   . Breast cancer Mother   . Breast cancer Maternal Grandmother   . Breast cancer Cousin     ALLERGIES:  is allergic to penicillins.  MEDICATIONS:  Current Outpatient Prescriptions  Medication Sig Dispense Refill  . lidocaine-prilocaine (EMLA) cream Apply to affected area once 30 g 3  . naproxen sodium (ANAPROX) 220 MG tablet Take 220-440 mg by mouth daily as needed (headache).    . ondansetron (ZOFRAN) 8 MG tablet Take 1 tablet (8 mg total) by mouth 2 (two) times daily as needed for refractory nausea / vomiting. Start on day 3 after chemotherapy. 30 tablet 1  . prochlorperazine (COMPAZINE) 10 MG tablet Take 1 tablet (10 mg total) by mouth every 6 (six) hours as needed (Nausea or vomiting). 30 tablet 1  . Pseudoeph-Doxylamine-DM-APAP (DAYQUIL/NYQUIL COLD/FLU RELIEF PO) Take 1 Dose by mouth daily as needed (flu symptoms).    . traMADol (ULTRAM) 50 MG tablet Take 1 tablet (50 mg total) by mouth every 6 (six) hours as needed for moderate pain. (Patient not taking: Reported on 09/05/2017) 30 tablet 0   No current facility-administered medications for this visit.     REVIEW OF SYSTEMS:   Constitutional: Denies fevers, chills or abnormal night sweats Eyes: Denies blurriness of vision, double vision or watery eyes Ears, nose, mouth, throat, and face: Denies mucositis or sore throat Respiratory: Denies cough, dyspnea or wheezes Cardiovascular: Denies palpitation, chest discomfort or lower extremity swelling Gastrointestinal:  Denies nausea, heartburn or change in bowel habits Skin: Denies abnormal skin rashes Lymphatics: Denies new lymphadenopathy or easy bruising Neurological:Denies numbness, tingling or new weaknesses Behavioral/Psych: Mood is stable, no new changes  Breast: Large palpable mass in the left axilla All other systems were reviewed with the patient and are negative.  PHYSICAL EXAMINATION: ECOG PERFORMANCE STATUS: 1 - Symptomatic but completely ambulatory  Vitals:    09/06/17 1605  BP: (!) 144/99  Pulse: 81  Resp: 20  Temp: 98.3 F (36.8 C)  SpO2: 100%   Filed Weights   09/06/17 1605  Weight: 179 lb 3.2 oz (81.3 kg)    GENERAL:alert, no distress and comfortable SKIN: skin color, texture, turgor are normal, no rashes or significant lesions EYES: normal, conjunctiva are pink and non-injected, sclera clear OROPHARYNX:no exudate, no erythema and lips, buccal mucosa, and tongue normal  NECK: supple, thyroid normal size, non-tender, without nodularity LYMPH:  no palpable lymphadenopathy in the cervical, axillary or inguinal LUNGS: clear to auscultation and percussion with normal breathing effort HEART: regular rate & rhythm and no murmurs and no lower extremity edema ABDOMEN:abdomen soft, non-tender and normal bowel sounds Musculoskeletal:no cyanosis of digits and no clubbing  PSYCH: alert & oriented x 3 with fluent speech NEURO: no focal motor/sensory deficits BREAST:large palpable left axillary mass. The breast lump is not palpable (exam performed in the presence of a chaperone)   LABORATORY DATA:  I have reviewed the  data as listed Lab Results  Component Value Date   WBC 17.6 (H) 05/30/2017   HGB 14.6 05/30/2017   HCT 44.4 05/30/2017   MCV 85.2 05/30/2017   PLT 275 05/30/2017   Lab Results  Component Value Date   NA 140 05/30/2017   K 3.4 (L) 05/30/2017   CL 105 05/30/2017   CO2 25 05/30/2017    RADIOGRAPHIC STUDIES: I have personally reviewed the radiological reports and agreed with the findings in the report.  ASSESSMENT AND PLAN:  Malignant neoplasm of lower-outer quadrant of left breast of female, estrogen receptor negative ([place]) 08/29/2017: Left breast 5:00 position: 2.7 cm mass, left axillary LN 3.5 cm, adjacent lymph node 0.9 cm, third lymph node 1.1 cm; Biopsy IDC grade 3 with DCIS, 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  Pathology and radiology counseling:  Discussed with the patient, the details of pathology including the type of breast cancer,the clinical staging, the significance of ER, PR and HER-2/neu receptors and the implications for treatment. After reviewing the pathology in detail, we proceeded to discuss the different treatment options between surgery, radiation, chemotherapy, antiestrogen therapies.  Recommendation: 1. Neoadjuvant chemotherapy with Carboplatin and gemcitabine day 1 and day 8 every 3 weeks. Patient previously received Adriamycin and cannot receive that anymore. 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 She could not receive any more taxanes at this time. Because of this we chose carboplatin and gemcitabine. 2. Followed by breast conserving surgery with targeted node dissection if possible 3. Followed by radiation (patient tells me that she will not receive radiation)  Chemotherapy Counseling: I discussed the risks and benefits of chemotherapy including the risks of nausea/ vomiting, risk of infection from low WBC count, fatigue due to chemo or anemia, bruising or bleeding due to low platelets, mouth sores, loss/ change in taste and decreased appetite. Liver and kidney function will be monitored through out chemotherapy as abnormalities in liver and kidney function may be a side effect of treatment. Risk of permanent bone marrow dysfunction and leukemia due to chemo were also discussed.  Plan: 1. Port placement 3. Chemotherapy class 4. Breast MRI 5. Ct CAP  Start chemotherapy this Friday  All questions were answered. The patient knows to call the clinic with any problems, questions or concerns.    Rulon Eisenmenger, MD 09/06/17

## 2017-09-06 NOTE — Assessment & Plan Note (Signed)
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  Pathology and radiology counseling: Discussed with the patient, the details of pathology including the type of breast cancer,the clinical staging, the significance of ER, PR and HER-2/neu receptors and the implications for treatment. After reviewing the pathology in detail, we proceeded to discuss the different treatment options between surgery, radiation, chemotherapy, antiestrogen therapies.  Recommendation: 1. Neoadjuvant chemotherapy with dose dense Adriamycin and Cytoxan 4 followed by Taxol- Carboplatin weekly 12 2. Followed by breast conserving surgery with targeted no dissection if possible 3. Followed by radiation  Chemotherapy Counseling: I discussed the risks and benefits of chemotherapy including the risks of nausea/ vomiting, risk of infection from low WBC count, fatigue due to chemo or anemia, bruising or bleeding due to low platelets, mouth sores, loss/ change in taste and decreased appetite. Liver and kidney function will be monitored through out chemotherapy as abnormalities in liver and kidney function may be a side effect of treatment. Cardiac dysfunction due to Adriamycin were discussed in detail. Risk of permanent bone marrow dysfunction and leukemia due to chemo were also discussed.  Plan: 1. Echocardiogram 2. Port placement 3. Chemotherapy class 4. Breast MRI  Start chemotherapy in one week

## 2017-09-06 NOTE — Progress Notes (Signed)
START OFF PATHWAY REGIMEN - Breast   [id number]:Gemcitabine + Carboplatin (1000/2) q21 Days:   A cycle is every 21 days:     Gemcitabine      Carboplatin   **Always confirm dose/schedule in your pharmacy ordering system**    Patient Characteristics: Preoperative or Nonsurgical Candidate (Clinical Staging), Neoadjuvant Therapy followed by Surgery, Invasive Disease, Chemotherapy, HER2 Negative/Unknown/Equivocal, ER Negative/Unknown, Platinum Therapy Indicated Therapeutic Status: Preoperative or Nonsurgical Candidate (Clinical Staging) AJCC M Category: cM0 AJCC Grade: G3 Breast Surgical Plan: Neoadjuvant Therapy followed by Surgery ER Status: Negative (-) AJCC 8 Stage Grouping: IIIB HER2 Status: Negative (-) AJCC T Category: cT2 AJCC N Category: cN1 PR Status: Negative (-) Type of Therapy: Platinum Therapy Indicated Intent of Therapy: Curative Intent, Discussed with Patient

## 2017-09-07 ENCOUNTER — Encounter (HOSPITAL_COMMUNITY): Payer: Self-pay | Admitting: *Deleted

## 2017-09-07 ENCOUNTER — Ambulatory Visit (HOSPITAL_COMMUNITY)
Admission: RE | Admit: 2017-09-07 | Discharge: 2017-09-07 | Disposition: A | Discharge status: Home / Self Care | Payer: No Typology Code available for payment source | Source: Ambulatory Visit | Attending: General Surgery | Admitting: General Surgery

## 2017-09-07 ENCOUNTER — Encounter: Payer: Self-pay | Admitting: *Deleted

## 2017-09-07 ENCOUNTER — Telehealth: Payer: Self-pay | Admitting: Hematology and Oncology

## 2017-09-07 DIAGNOSIS — C50512 Malignant neoplasm of lower-outer quadrant of left female breast: Secondary | ICD-10-CM | POA: Insufficient documentation

## 2017-09-07 MED ORDER — GADOBENATE DIMEGLUMINE 529 MG/ML IV SOLN: 20.0000 mL | Freq: Once | INTRAVENOUS | Status: DC | PRN

## 2017-09-07 NOTE — Telephone Encounter (Signed)
Spoke with patient and scheduled chemo ed class per their availability.

## 2017-09-07 NOTE — Telephone Encounter (Signed)
Spoke to patient regarding upcoming October appointments. °

## 2017-09-07 NOTE — Progress Notes (Signed)
Pt denies SOB, chest pain, and being under the care of a cardiologist. Pt stated that she was treated for TB; last treatment was June 2018. Pt denies having a stress test, echo and cardiac cath. Pt denies having an EKG within the last year. Pt denies recent labs. Requested chest x ray from [facility] in [place], [place]. Pt made aware to stop taking vitamins, fish oil, Day/Nyquil and herbal medications. Do not take any NSAIDs ie: Ibuprofen, Advil, Naproxen (Aleve), Motrin, BC and Goody Powder or any medication containing Aspirin. Pt verbalized understanding of all pre-op instructions.

## 2017-09-08 ENCOUNTER — Encounter (HOSPITAL_COMMUNITY)
Admission: RE | Disposition: A | Discharge status: Home / Self Care | Payer: Self-pay | Source: Ambulatory Visit | Attending: General Surgery

## 2017-09-08 ENCOUNTER — Ambulatory Visit (HOSPITAL_COMMUNITY): Payer: Self-pay | Admitting: Anesthesiology

## 2017-09-08 ENCOUNTER — Ambulatory Visit (HOSPITAL_COMMUNITY)
Admission: RE | Admit: 2017-09-08 | Discharge: 2017-09-08 | Disposition: A | Discharge status: Home / Self Care | Payer: Self-pay | Source: Ambulatory Visit | Attending: General Surgery | Admitting: General Surgery

## 2017-09-08 ENCOUNTER — Encounter (HOSPITAL_COMMUNITY): Payer: Self-pay | Admitting: General Practice

## 2017-09-08 ENCOUNTER — Ambulatory Visit (HOSPITAL_COMMUNITY): Payer: Self-pay

## 2017-09-08 DIAGNOSIS — J439 Emphysema, unspecified: Secondary | ICD-10-CM | POA: Insufficient documentation

## 2017-09-08 DIAGNOSIS — C773 Secondary and unspecified malignant neoplasm of axilla and upper limb lymph nodes: Secondary | ICD-10-CM | POA: Insufficient documentation

## 2017-09-08 DIAGNOSIS — Z452 Encounter for adjustment and management of vascular access device: Secondary | ICD-10-CM

## 2017-09-08 DIAGNOSIS — C50912 Malignant neoplasm of unspecified site of left female breast: Secondary | ICD-10-CM | POA: Insufficient documentation

## 2017-09-08 DIAGNOSIS — Z79899 Other long term (current) drug therapy: Secondary | ICD-10-CM | POA: Insufficient documentation

## 2017-09-08 DIAGNOSIS — Z419 Encounter for procedure for purposes other than remedying health state, unspecified: Secondary | ICD-10-CM

## 2017-09-08 DIAGNOSIS — Z79891 Long term (current) use of opiate analgesic: Secondary | ICD-10-CM | POA: Insufficient documentation

## 2017-09-08 HISTORY — DX: Headache, unspecified: R51.9

## 2017-09-08 HISTORY — DX: Headache: R51

## 2017-09-08 HISTORY — DX: Other specified postprocedural states: R11.2

## 2017-09-08 HISTORY — DX: Other specified postprocedural states: Z98.890

## 2017-09-08 HISTORY — PX: PORTACATH PLACEMENT: SHX2246

## 2017-09-08 HISTORY — DX: Respiratory tuberculosis unspecified: A15.9

## 2017-09-08 LAB — POCT I-STAT CREATININE: CREATININE: 0.7 mg/dL (ref 0.44–1.00)

## 2017-09-08 LAB — COMPREHENSIVE METABOLIC PANEL
ALT: 14 U/L (ref 14–54)
AST: 20 U/L (ref 15–41)
Albumin: 4.3 g/dL (ref 3.5–5.0)
Alkaline Phosphatase: 93 U/L (ref 38–126)
Anion gap: 12 (ref 5–15)
BUN: <5 mg/dL — ABNORMAL LOW (ref 6–20)
CO2: 21 mmol/L — ABNORMAL LOW (ref 22–32)
Calcium: 9.2 mg/dL (ref 8.9–10.3)
Chloride: 105 mmol/L (ref 101–111)
Creatinine, Ser: 0.71 mg/dL (ref 0.44–1.00)
GFR calc Af Amer: >60 mL/min (ref >60)
GFR calc non Af Amer: >60 mL/min (ref >60)
Glucose, Bld: 94 mg/dL (ref 65–99)
Potassium: 3.6 mmol/L (ref 3.5–5.1)
Sodium: 138 mmol/L (ref 135–145)
Total Bilirubin: 0.9 mg/dL (ref 0.3–1.2)
Total Protein: 7.9 g/dL (ref 6.5–8.1)

## 2017-09-08 LAB — CBC
HCT: 43.6 % (ref 36.0–46.0)
Hemoglobin: 14.4 g/dL (ref 12.0–15.0)
MCH: 27.7 pg (ref 26.0–34.0)
MCHC: 33 g/dL (ref 30.0–36.0)
MCV: 83.8 fL (ref 78.0–100.0)
PLATELETS: 270 10*3/uL (ref 150–400)
RBC: 5.2 MIL/uL — AB (ref 3.87–5.11)
RDW: 14.1 % (ref 11.5–15.5)
WBC: 11 10*3/uL — ABNORMAL HIGH (ref 4.0–10.5)

## 2017-09-08 SURGERY — INSERTION, TUNNELED CENTRAL VENOUS DEVICE, WITH PORT
Anesthesia: General | Laterality: Left

## 2017-09-08 MED ORDER — OXYCODONE HCL 5 MG/5ML PO SOLN: 5.0000 mg | Freq: Once | ORAL | Status: AC | PRN

## 2017-09-08 MED ORDER — KETOROLAC TROMETHAMINE 30 MG/ML IJ SOLN
30.0000 mg | Freq: Once | INTRAMUSCULAR | Status: DC | PRN
  Administered 2017-09-08: 30 mg via INTRAVENOUS

## 2017-09-08 MED ORDER — FENTANYL CITRATE (PF) 100 MCG/2ML IJ SOLN
INTRAMUSCULAR | Status: DC | PRN
  Administered 2017-09-08: 100 ug via INTRAVENOUS

## 2017-09-08 MED ORDER — PROMETHAZINE HCL 25 MG/ML IJ SOLN: 6.2500 mg | INTRAMUSCULAR | Status: DC | PRN

## 2017-09-08 MED ORDER — LACTATED RINGERS IV SOLN
INTRAVENOUS | Status: DC
  Administered 2017-09-08 (×2): via INTRAVENOUS

## 2017-09-08 MED ORDER — FENTANYL CITRATE (PF) 250 MCG/5ML IJ SOLN
INTRAMUSCULAR | Status: AC
  Filled 2017-09-08: qty 5

## 2017-09-08 MED ORDER — BUPIVACAINE-EPINEPHRINE (PF) 0.25% -1:200000 IJ SOLN
INTRAMUSCULAR | Status: DC | PRN
  Administered 2017-09-08: 18 mL

## 2017-09-08 MED ORDER — PROPOFOL 10 MG/ML IV BOLUS
INTRAVENOUS | Status: AC
  Filled 2017-09-08: qty 20

## 2017-09-08 MED ORDER — CIPROFLOXACIN IN D5W 400 MG/200ML IV SOLN
400.0000 mg | INTRAVENOUS | Status: AC
  Administered 2017-09-08: 400 mg via INTRAVENOUS

## 2017-09-08 MED ORDER — KETOROLAC TROMETHAMINE 30 MG/ML IJ SOLN
INTRAMUSCULAR | Status: AC
  Filled 2017-09-08: qty 1

## 2017-09-08 MED ORDER — OXYCODONE HCL 5 MG PO TABS: 5.0000 mg | ORAL_TABLET | Freq: Once | ORAL | Status: DC | PRN

## 2017-09-08 MED ORDER — HEPARIN SOD (PORK) LOCK FLUSH 100 UNIT/ML IV SOLN
INTRAVENOUS | Status: AC
  Filled 2017-09-08: qty 5

## 2017-09-08 MED ORDER — ONDANSETRON HCL 4 MG/2ML IJ SOLN
INTRAMUSCULAR | Status: AC
  Filled 2017-09-08: qty 2

## 2017-09-08 MED ORDER — HYDROMORPHONE HCL 1 MG/ML IJ SOLN: 0.2500 mg | INTRAMUSCULAR | Status: DC | PRN

## 2017-09-08 MED ORDER — OXYCODONE HCL 5 MG PO TABS
ORAL_TABLET | ORAL | Status: AC
  Filled 2017-09-08: qty 1

## 2017-09-08 MED ORDER — MIDAZOLAM HCL 2 MG/2ML IJ SOLN
INTRAMUSCULAR | Status: AC
  Filled 2017-09-08: qty 2

## 2017-09-08 MED ORDER — ONDANSETRON HCL 4 MG/2ML IJ SOLN
INTRAMUSCULAR | Status: DC | PRN
  Administered 2017-09-08: 4 mg via INTRAVENOUS

## 2017-09-08 MED ORDER — SODIUM CHLORIDE 0.9 % IV SOLN
INTRAVENOUS | Status: DC | PRN
  Administered 2017-09-08: 16:00:00

## 2017-09-08 MED ORDER — SCOPOLAMINE 1 MG/3DAYS TD PT72
MEDICATED_PATCH | TRANSDERMAL | Status: AC
  Filled 2017-09-08: qty 1

## 2017-09-08 MED ORDER — PHENYLEPHRINE HCL 10 MG/ML IJ SOLN
INTRAMUSCULAR | Status: DC | PRN
  Administered 2017-09-08 (×4): 80 ug via INTRAVENOUS

## 2017-09-08 MED ORDER — HEPARIN SOD (PORK) LOCK FLUSH 100 UNIT/ML IV SOLN
INTRAVENOUS | Status: DC | PRN
  Administered 2017-09-08: 500 [IU] via INTRAVENOUS

## 2017-09-08 MED ORDER — CHLORHEXIDINE GLUCONATE CLOTH 2 % EX PADS: 6.0000 | MEDICATED_PAD | Freq: Once | CUTANEOUS | Status: DC

## 2017-09-08 MED ORDER — OXYCODONE HCL 5 MG/5ML PO SOLN: 5.0000 mg | Freq: Once | ORAL | Status: DC | PRN

## 2017-09-08 MED ORDER — CIPROFLOXACIN IN D5W 400 MG/200ML IV SOLN
INTRAVENOUS | Status: AC
  Filled 2017-09-08: qty 200

## 2017-09-08 MED ORDER — LIDOCAINE 2% (20 MG/ML) 5 ML SYRINGE
INTRAMUSCULAR | Status: DC | PRN
  Administered 2017-09-08: 80 mg via INTRAVENOUS

## 2017-09-08 MED ORDER — 0.9 % SODIUM CHLORIDE (POUR BTL) OPTIME
TOPICAL | Status: DC | PRN
  Administered 2017-09-08: 1000 mL

## 2017-09-08 MED ORDER — MIDAZOLAM HCL 5 MG/5ML IJ SOLN
INTRAMUSCULAR | Status: DC | PRN
  Administered 2017-09-08: 1 mg via INTRAVENOUS

## 2017-09-08 MED ORDER — LIDOCAINE 2% (20 MG/ML) 5 ML SYRINGE
INTRAMUSCULAR | Status: AC
  Filled 2017-09-08: qty 5

## 2017-09-08 MED ORDER — OXYCODONE HCL 5 MG PO TABS
5.0000 mg | ORAL_TABLET | Freq: Once | ORAL | Status: AC | PRN
  Administered 2017-09-08: 5 mg via ORAL

## 2017-09-08 MED ORDER — OXYCODONE HCL 5 MG PO TABS: 5.0000 mg | ORAL_TABLET | Freq: Four times a day (QID) | ORAL | 0 refills | Status: DC | PRN

## 2017-09-08 MED ORDER — FENTANYL CITRATE (PF) 100 MCG/2ML IJ SOLN: 25.0000 ug | INTRAMUSCULAR | Status: DC | PRN

## 2017-09-08 MED ORDER — BUPIVACAINE-EPINEPHRINE 0.25% -1:200000 IJ SOLN
INTRAMUSCULAR | Status: AC
  Filled 2017-09-08: qty 1

## 2017-09-08 MED ORDER — DEXAMETHASONE SODIUM PHOSPHATE 10 MG/ML IJ SOLN
INTRAMUSCULAR | Status: DC | PRN
  Administered 2017-09-08: 10 mg via INTRAVENOUS

## 2017-09-08 MED ORDER — PROPOFOL 10 MG/ML IV BOLUS
INTRAVENOUS | Status: DC | PRN
  Administered 2017-09-08: 160 mg via INTRAVENOUS
  Administered 2017-09-08: 40 mg via INTRAVENOUS

## 2017-09-08 MED ORDER — DEXAMETHASONE SODIUM PHOSPHATE 10 MG/ML IJ SOLN
INTRAMUSCULAR | Status: AC
  Filled 2017-09-08: qty 1

## 2017-09-08 MED ORDER — SCOPOLAMINE 1 MG/3DAYS TD PT72
MEDICATED_PATCH | TRANSDERMAL | Status: DC | PRN
  Administered 2017-09-08: 1 mg via TRANSDERMAL

## 2017-09-08 MED ORDER — LIDOCAINE HCL (PF) 1 % IJ SOLN
INTRAMUSCULAR | Status: AC
  Filled 2017-09-08: qty 30

## 2017-09-08 SURGICAL SUPPLY — 51 items
BAG DECANTER FOR FLEXI CONT (MISCELLANEOUS) ×3 IMPLANT
BLADE HEX COATED 2.75 (ELECTRODE) ×3 IMPLANT
BLADE SURG 11 STRL SS (BLADE) ×3 IMPLANT
BLADE SURG 15 STRL LF DISP TIS (BLADE) ×1 IMPLANT
BLADE SURG 15 STRL SS (BLADE) ×2
CANISTER SUCT 3000ML PPV (MISCELLANEOUS) IMPLANT
CHLORAPREP W/TINT 10.5 ML (MISCELLANEOUS) ×3 IMPLANT
COVER SURGICAL LIGHT HANDLE (MISCELLANEOUS) ×3 IMPLANT
COVER TRANSDUCER ULTRASND GEL (DRAPE) IMPLANT
CRADLE DONUT ADULT HEAD (MISCELLANEOUS) ×3 IMPLANT
DECANTER SPIKE VIAL GLASS SM (MISCELLANEOUS) ×6 IMPLANT
DERMABOND ADHESIVE PROPEN (GAUZE/BANDAGES/DRESSINGS) ×2
DERMABOND ADVANCED (GAUZE/BANDAGES/DRESSINGS) ×2
DERMABOND ADVANCED .7 DNX12 (GAUZE/BANDAGES/DRESSINGS) ×1 IMPLANT
DERMABOND ADVANCED .7 DNX6 (GAUZE/BANDAGES/DRESSINGS) ×1 IMPLANT
DRAPE C-ARM 42X72 X-RAY (DRAPES) ×3 IMPLANT
DRAPE CHEST BREAST 15X10 FENES (DRAPES) ×3 IMPLANT
DRAPE UTILITY XL STRL (DRAPES) ×6 IMPLANT
DRAPE WARM FLUID 44X44 (DRAPE) IMPLANT
DRSG TEGADERM 4X4.75 (GAUZE/BANDAGES/DRESSINGS) ×3 IMPLANT
ELECT REM PT RETURN 9FT ADLT (ELECTROSURGICAL) ×3
ELECTRODE REM PT RTRN 9FT ADLT (ELECTROSURGICAL) ×1 IMPLANT
GAUZE SPONGE 4X4 12PLY STRL (GAUZE/BANDAGES/DRESSINGS) ×3 IMPLANT
GAUZE SPONGE 4X4 16PLY XRAY LF (GAUZE/BANDAGES/DRESSINGS) ×3 IMPLANT
GEL ULTRASOUND 20GR AQUASONIC (MISCELLANEOUS) IMPLANT
GLOVE BIO SURGEON STRL SZ 6 (GLOVE) ×3 IMPLANT
GLOVE BIOGEL PI IND STRL 6.5 (GLOVE) ×1 IMPLANT
GLOVE BIOGEL PI INDICATOR 6.5 (GLOVE) ×2
GOWN STRL REUS W/ TWL LRG LVL3 (GOWN DISPOSABLE) ×1 IMPLANT
GOWN STRL REUS W/TWL 2XL LVL3 (GOWN DISPOSABLE) ×3 IMPLANT
GOWN STRL REUS W/TWL LRG LVL3 (GOWN DISPOSABLE) ×2
KIT BASIN OR (CUSTOM PROCEDURE TRAY) ×3 IMPLANT
KIT PORT POWER 8FR ISP CVUE (Miscellaneous) ×3 IMPLANT
KIT ROOM TURNOVER OR (KITS) ×3 IMPLANT
NEEDLE HYPO 25GX1X1/2 BEV (NEEDLE) ×3 IMPLANT
NS IRRIG 1000ML POUR BTL (IV SOLUTION) ×3 IMPLANT
PACK SURGICAL SETUP 50X90 (CUSTOM PROCEDURE TRAY) ×3 IMPLANT
PAD ARMBOARD 7.5X6 YLW CONV (MISCELLANEOUS) ×3 IMPLANT
PENCIL BUTTON HOLSTER BLD 10FT (ELECTRODE) ×3 IMPLANT
SUT MON AB 4-0 PC3 18 (SUTURE) ×3 IMPLANT
SUT PROLENE 2 0 SH DA (SUTURE) ×6 IMPLANT
SUT VIC AB 3-0 SH 27 (SUTURE) ×2
SUT VIC AB 3-0 SH 27X BRD (SUTURE) ×1 IMPLANT
SYR 20ML ECCENTRIC (SYRINGE) ×6 IMPLANT
SYR 5ML LUER SLIP (SYRINGE) ×3 IMPLANT
SYR CONTROL 10ML LL (SYRINGE) ×3 IMPLANT
TOWEL OR 17X24 6PK STRL BLUE (TOWEL DISPOSABLE) ×3 IMPLANT
TOWEL OR 17X26 10 PK STRL BLUE (TOWEL DISPOSABLE) ×3 IMPLANT
TUBE CONNECTING 12'X1/4 (SUCTIONS)
TUBE CONNECTING 12X1/4 (SUCTIONS) IMPLANT
YANKAUER SUCT BULB TIP NO VENT (SUCTIONS) IMPLANT

## 2017-09-08 NOTE — Op Note (Signed)
PREOPERATIVE DIAGNOSIS:  Left breast cancer, cT2N1, LOQ, -/-/-     POSTOPERATIVE DIAGNOSIS:  Same     PROCEDURE: Left subclavian port placement, Bard ClearVue  Power Port, MRI safe, 8-French.      SURGEON:  Stark Klein, MD      ANESTHESIA:  General   FINDINGS:  Good venous return, easy flush, and tip of the catheter and   SVC 22 cm.      SPECIMEN:  None.      ESTIMATED BLOOD LOSS:  Minimal.      COMPLICATIONS:  None known.      PROCEDURE:  Pt was identified in the holding area and taken to   the operating room, where patient was placed supine on the operating room   table.  General anesthesia was induced.  Patient's arms were tucked and the upper   chest and neck were prepped and draped in sterile fashion.  Time-out was   performed according to the surgical safety check list.  When all was   correct, we continued.   Local anesthetic was administered over this   area at the angle of the clavicle.  The vein was accessed with 1 pass(es) of the needle. There was good venous return and the wire passed easily with no ectopy.   Fluoroscopy was used to confirm that the wire was in the vena cava.      The patient was placed back level and the area for the pocket was anethetized   with local anesthetic.  A 3-cm transverse incision was made with a #15   blade.  Cautery was used to divide the subcutaneous tissues down to the   pectoralis muscle.  An [employer] retractor was used to elevate the skin   while a pocket was created on top of the pectoralis fascia.  The port   was placed into the pocket to confirm that it was of adequate size.  The   catheter was preattached to the port.  The port was then secured to the   pectoralis fascia with four 2-0 Prolene sutures.  These were clamped and   not tied down yet.    The catheter was tunneled through to the wire exit   site.  The catheter was placed along the wire to determine what length it should be to be in the SVC.  The catheter was cut at  22 cm.  The tunneler sheath and dilator were passed over the wire and the dilator and wire were removed.  The catheter was advanced through the tunneler sheath and the tunneler sheath was pulled away.  Care was taken to keep the catheter in the tunneler sheath as this occurred. This was advanced and the tunneler sheath was removed.  There was good venous   return and easy flush of the catheter.  The Prolene sutures were tied   down to the pectoral fascia.  The skin was reapproximated using 3-0   Vicryl interrupted deep dermal sutures.    Fluoroscopy was used to re-confirm good position of the catheter.  The skin   was then closed using 4-0 Monocryl in a subcuticular fashion.  The wounds were then cleaned, dried, and dressed with Dermabond.  The port was left accessed.  The port was flushed with concentrated heparin flush as well. Gauze and tegaderm were used to dress the access device.   The patient was awakened from anesthesia and taken to the PACU in stable condition.  Needle, sponge, and instrument counts were  correct.               Stark Klein, MD

## 2017-09-08 NOTE — Transfer of Care (Signed)
Immediate Anesthesia Transfer of Care Note  Patient: Suzanne May  Procedure(s) Performed: INSERTION PORT-A-CATH ERAS PATHWAY (Left )  Patient Location: PACU  Anesthesia Type:General  Level of Consciousness: awake, oriented and patient cooperative  Airway & Oxygen Therapy: Patient Spontanous Breathing and Patient connected to nasal cannula oxygen  Post-op Assessment: Report given to RN and Post -op Vital signs reviewed and stable  Post vital signs: Reviewed  Last Vitals:  Vitals:   09/08/17 1232 09/08/17 1336  BP: (!) 194/99 (!) 208/88  Pulse: 68   Resp: 18   Temp: 36.8 C   SpO2: 100%     Last Pain:  Vitals:   09/08/17 1232  TempSrc: Oral      Patients Stated Pain Goal: 2 (56/81/27 5170)  Complications: No apparent anesthesia complications

## 2017-09-08 NOTE — Interval H&P Note (Signed)
History and Physical Interval Note:  09/08/2017 1:30 PM  Suzanne May  has presented today for surgery, with the diagnosis of left breast cancer  The various methods of treatment have been discussed with the patient and family. After consideration of risks, benefits and other options for treatment, the patient has consented to  Procedure(s): [place] (N/A) as a surgical intervention .  The patient's history has been reviewed, patient examined, no change in status, stable for surgery.  I have reviewed the patient's chart and labs.  Questions were answered to the patient's satisfaction.     [name]

## 2017-09-08 NOTE — Anesthesia Preprocedure Evaluation (Signed)
Anesthesia Evaluation  Patient identified by MRN, date of birth, ID band Patient awake    Reviewed: Allergy & Precautions, NPO status   History of Anesthesia Complications (+) PONV and history of anesthetic complications  Airway Mallampati: II   Neck ROM: Full    Dental   Pulmonary former smoker,    breath sounds clear to auscultation       Cardiovascular negative cardio ROS   Rhythm:Regular Rate:Normal     Neuro/Psych  Headaches,    GI/Hepatic negative GI ROS, Neg liver ROS,   Endo/Other  negative endocrine ROS  Renal/GU negative Renal ROS     Musculoskeletal negative musculoskeletal ROS (+)   Abdominal   Peds  Hematology negative hematology ROS (+)   Anesthesia Other Findings   Reproductive/Obstetrics                             Anesthesia Physical Anesthesia Plan  ASA: II  Anesthesia Plan: General   Post-op Pain Management:    Induction: Intravenous  PONV Risk Score and Plan: 4 or greater and Ondansetron, Dexamethasone, Midazolam, Scopolamine patch - Pre-op, Propofol infusion and Treatment may vary due to age or medical condition  Airway Management Planned: LMA  Additional Equipment:   Intra-op Plan:   Post-operative Plan: Extubation in OR  Informed Consent: I have reviewed the patients History and Physical, chart, labs and discussed the procedure including the risks, benefits and alternatives for the proposed anesthesia with the patient or authorized representative who has indicated his/her understanding and acceptance.   Dental advisory given  Plan Discussed with: CRNA  Anesthesia Plan Comments:         Anesthesia Quick Evaluation

## 2017-09-08 NOTE — Anesthesia Preprocedure Evaluation (Addendum)
Anesthesia Evaluation  Patient identified by MRN, date of birth, ID band Patient awake    Reviewed: Allergy & Precautions, NPO status , Patient's Chart, lab work & pertinent test results  History of Anesthesia Complications (+) PONV  Airway Mallampati: I  TM Distance: >3 FB Neck ROM: Full    Dental no notable dental hx. (+) Teeth Intact, Dental Advisory Given   Pulmonary neg pulmonary ROS, former smoker,    Pulmonary exam normal breath sounds clear to auscultation       Cardiovascular negative cardio ROS Normal cardiovascular exam Rhythm:Regular Rate:Normal     Neuro/Psych  Headaches, negative neurological ROS  negative psych ROS   GI/Hepatic negative GI ROS, Neg liver ROS,   Endo/Other  negative endocrine ROS  Renal/GU negative Renal ROS  negative genitourinary   Musculoskeletal negative musculoskeletal ROS (+)   Abdominal   Peds negative pediatric ROS (+)  Hematology negative hematology ROS (+)   Anesthesia Other Findings   Reproductive/Obstetrics negative OB ROS                           Anesthesia Physical Anesthesia Plan  ASA: II  Anesthesia Plan: General   Post-op Pain Management:    Induction: Intravenous  PONV Risk Score and Plan: 4 or greater and Ondansetron, Dexamethasone, Midazolam, Scopolamine patch - Pre-op, Propofol infusion and Treatment may vary due to age or medical condition  Airway Management Planned: LMA  Additional Equipment:   Intra-op Plan:   Post-operative Plan: Extubation in OR  Informed Consent: I have reviewed the patients History and Physical, chart, labs and discussed the procedure including the risks, benefits and alternatives for the proposed anesthesia with the patient or authorized representative who has indicated his/her understanding and acceptance.   Dental advisory given  Plan Discussed with: CRNA and Surgeon  Anesthesia Plan  Comments:        Anesthesia Quick Evaluation

## 2017-09-08 NOTE — Anesthesia Procedure Notes (Signed)
Procedure Name: LMA Insertion Date/Time: 09/08/2017 3:34 PM Performed by: Luciana Axe K Pre-anesthesia Checklist: Patient identified, Emergency Drugs available, Suction available and Patient being monitored Patient Re-evaluated:Patient Re-evaluated prior to induction Oxygen Delivery Method: Circle System Utilized Preoxygenation: Pre-oxygenation with 100% oxygen Induction Type: IV induction Ventilation: Mask ventilation without difficulty LMA: LMA inserted LMA Size: 4.0 Number of attempts: 1 Airway Equipment and Method: Bite block Placement Confirmation: positive ETCO2 and breath sounds checked- equal and bilateral Tube secured with: Tape Dental Injury: Teeth and Oropharynx as per pre-operative assessment

## 2017-09-08 NOTE — Discharge Instructions (Addendum)
[place] Office Phone Number [phone]   POST OP INSTRUCTIONS  Always review your discharge instruction sheet given to you by the facility where your surgery was performed.  IF YOU HAVE DISABILITY OR FAMILY LEAVE FORMS, YOU MUST BRING THEM TO THE OFFICE FOR PROCESSING.  DO NOT GIVE THEM TO YOUR DOCTOR.  1. A prescription for pain medication may be given to you upon discharge.  Take your pain medication as prescribed, if needed.  If narcotic pain medicine is not needed, then you may take acetaminophen (Tylenol) or ibuprofen (Advil) as needed. 2. Take your usually prescribed medications unless otherwise directed 3. If you need a refill on your pain medication, please contact your pharmacy.  They will contact our office to request authorization.  Prescriptions will not be filled after 5pm or on week-ends. 4. You should eat very light the first 24 hours after surgery, such as soup, crackers, pudding, etc.  Resume your normal diet the day after surgery 5. It is common to experience some constipation if taking pain medication after surgery.  Increasing fluid intake and taking a stool softener will usually help or prevent this problem from occurring.  A mild laxative (Milk of Magnesia or Miralax) should be taken according to package directions if there are no bowel movements after 48 hours. 6. You may shower in 48 hours.  The surgical glue will flake off in 2-3 weeks.   7. ACTIVITIES:  No strenuous activity or heavy lifting for 1 week.   a. You may drive when you no longer are taking prescription pain medication, you can comfortably wear a seatbelt, and you can safely maneuver your car and apply brakes. b. RETURN TO WORK:  __________5-7 days possibly depending on chemo_______________ You should see your doctor in the office for a follow-up appointment approximately three-four weeks after your surgery.    WHEN TO CALL YOUR DOCTOR: 1. Fever over 101.0 2. Nausea and/or  vomiting. 3. Extreme swelling or bruising. 4. Continued bleeding from incision. 5. Increased pain, redness, or drainage from the incision.  The clinic staff is available to answer your questions during regular business hours.  Please dont hesitate to call and ask to speak to one of the nurses for clinical concerns.  If you have a medical emergency, go to the nearest emergency room or call 911.  A surgeon from [facility] is always on call at the hospital.  For further questions, please visit [email or web address]

## 2017-09-08 NOTE — Progress Notes (Signed)
Dr. Glennon Mac made aware of patient's blood pressure.

## 2017-09-09 ENCOUNTER — Other Ambulatory Visit: Payer: No Typology Code available for payment source

## 2017-09-09 ENCOUNTER — Ambulatory Visit (HOSPITAL_BASED_OUTPATIENT_CLINIC_OR_DEPARTMENT_OTHER): Payer: Self-pay

## 2017-09-09 ENCOUNTER — Ambulatory Visit: Payer: No Typology Code available for payment source

## 2017-09-09 ENCOUNTER — Encounter (HOSPITAL_COMMUNITY): Payer: Self-pay | Admitting: General Surgery

## 2017-09-09 ENCOUNTER — Other Ambulatory Visit: Payer: Self-pay | Admitting: Hematology and Oncology

## 2017-09-09 ENCOUNTER — Encounter: Payer: Self-pay | Admitting: Adult Health

## 2017-09-09 ENCOUNTER — Other Ambulatory Visit (HOSPITAL_BASED_OUTPATIENT_CLINIC_OR_DEPARTMENT_OTHER): Payer: Self-pay

## 2017-09-09 VITALS — BP 164/74 | HR 76 | Temp 99.1°F | Resp 16

## 2017-09-09 DIAGNOSIS — Z171 Estrogen receptor negative status [ER-]: Principal | ICD-10-CM

## 2017-09-09 DIAGNOSIS — C50512 Malignant neoplasm of lower-outer quadrant of left female breast: Secondary | ICD-10-CM

## 2017-09-09 DIAGNOSIS — Z5111 Encounter for antineoplastic chemotherapy: Secondary | ICD-10-CM

## 2017-09-09 DIAGNOSIS — Z95828 Presence of other vascular implants and grafts: Secondary | ICD-10-CM | POA: Insufficient documentation

## 2017-09-09 LAB — CBC WITH DIFFERENTIAL/PLATELET
BASO%: 0.3 % (ref 0.0–2.0)
BASOS ABS: 0.1 10*3/uL (ref 0.0–0.1)
EOS ABS: 0 10*3/uL (ref 0.0–0.5)
EOS%: 0 % (ref 0.0–7.0)
HCT: 41.3 % (ref 34.8–46.6)
HGB: 13.3 g/dL (ref 11.6–15.9)
LYMPH%: 11.7 % — AB (ref 14.0–49.7)
MCH: 26.8 pg (ref 25.1–34.0)
MCHC: 32.3 g/dL (ref 31.5–36.0)
MCV: 83.2 fL (ref 79.5–101.0)
MONO#: 1 10*3/uL — AB (ref 0.1–0.9)
MONO%: 5 % (ref 0.0–14.0)
NEUT%: 83 % — AB (ref 38.4–76.8)
NEUTROS ABS: 16.4 10*3/uL — AB (ref 1.5–6.5)
PLATELETS: 291 10*3/uL (ref 145–400)
RBC: 4.96 10*6/uL (ref 3.70–5.45)
RDW: 14.4 % (ref 11.2–14.5)
WBC: 19.8 10*3/uL — AB (ref 3.9–10.3)
lymph#: 2.3 10*3/uL (ref 0.9–3.3)

## 2017-09-09 LAB — COMPREHENSIVE METABOLIC PANEL
ALK PHOS: 81 U/L (ref 40–150)
ALT: 12 U/L (ref 0–55)
ANION GAP: 10 meq/L (ref 3–11)
AST: 14 U/L (ref 5–34)
Albumin: 4.2 g/dL (ref 3.5–5.0)
BILIRUBIN TOTAL: 0.7 mg/dL (ref 0.20–1.20)
BUN: 10.7 mg/dL (ref 7.0–26.0)
CO2: 25 meq/L (ref 22–29)
Calcium: 9.6 mg/dL (ref 8.4–10.4)
Chloride: 103 mEq/L (ref 98–109)
Creatinine: 0.8 mg/dL (ref 0.6–1.1)
Glucose: 107 mg/dl (ref 70–140)
Potassium: 4.1 mEq/L (ref 3.5–5.1)
Sodium: 138 mEq/L (ref 136–145)
TOTAL PROTEIN: 7.9 g/dL (ref 6.4–8.3)

## 2017-09-09 MED ORDER — HEPARIN SOD (PORK) LOCK FLUSH 100 UNIT/ML IV SOLN
500.0000 [IU] | Freq: Once | INTRAVENOUS | Status: AC | PRN
  Administered 2017-09-09: 500 [IU]
  Filled 2017-09-09: qty 5

## 2017-09-09 MED ORDER — SODIUM CHLORIDE 0.9 % IV SOLN
258.8000 mg | Freq: Once | INTRAVENOUS | Status: AC
  Administered 2017-09-09: 260 mg via INTRAVENOUS
  Filled 2017-09-09: qty 26

## 2017-09-09 MED ORDER — SODIUM CHLORIDE 0.9 % IV SOLN: 10.0000 mg | Freq: Once | INTRAVENOUS | Status: DC

## 2017-09-09 MED ORDER — SODIUM CHLORIDE 0.9% FLUSH
10.0000 mL | INTRAVENOUS | Status: DC | PRN
  Administered 2017-09-09: 10 mL
  Filled 2017-09-09: qty 10

## 2017-09-09 MED ORDER — PALONOSETRON HCL INJECTION 0.25 MG/5ML
INTRAVENOUS | Status: AC
  Filled 2017-09-09: qty 5

## 2017-09-09 MED ORDER — DEXAMETHASONE SODIUM PHOSPHATE 10 MG/ML IJ SOLN
10.0000 mg | Freq: Once | INTRAMUSCULAR | Status: AC
  Administered 2017-09-09: 10 mg via INTRAVENOUS

## 2017-09-09 MED ORDER — DEXAMETHASONE SODIUM PHOSPHATE 10 MG/ML IJ SOLN
INTRAMUSCULAR | Status: AC
  Filled 2017-09-09: qty 1

## 2017-09-09 MED ORDER — SODIUM CHLORIDE 0.9% FLUSH
10.0000 mL | Freq: Once | INTRAVENOUS | Status: AC
  Administered 2017-09-09: 10 mL
  Filled 2017-09-09: qty 10

## 2017-09-09 MED ORDER — GEMCITABINE HCL CHEMO INJECTION 1 GM/26.3ML
1000.0000 mg/m2 | Freq: Once | INTRAVENOUS | Status: AC
  Administered 2017-09-09: 1976 mg via INTRAVENOUS
  Filled 2017-09-09: qty 51.97

## 2017-09-09 MED ORDER — PALONOSETRON HCL INJECTION 0.25 MG/5ML
0.2500 mg | Freq: Once | INTRAVENOUS | Status: AC
  Administered 2017-09-09: 0.25 mg via INTRAVENOUS

## 2017-09-09 MED ORDER — SODIUM CHLORIDE 0.9 % IV SOLN
Freq: Once | INTRAVENOUS | Status: AC
  Administered 2017-09-09: 14:00:00 via INTRAVENOUS

## 2017-09-09 NOTE — Progress Notes (Signed)
Met with patient and family. Introduced myself as Arboriculturist and asked about financial questions and concerns.   Advised patient of [employer] grant that she may apply for to help with personal expenses during treatment. She wanted to know the income guidelines. Advised her and they are currently over the income for a household of 2. Patient states she figured. Advised them they will automatically receive a 55%discount for being uninsured. Her spouse states she will have coverage beginning Jan 2019. Advised at that time, treatment will be sent to insurance for authorization and then billed to insurance and remaining amount would be billed to patient. Discussed the hardship settlement and access one. Advised these are options available through the billing department.  Advised patient if there are drugs available through drug replacement, Rob in pharmacy will reach out to apply.  [place] application for them living in [place] and [employer] number for any questions relating to their application.  Gave them my card for any additional financial questions or concerns.

## 2017-09-09 NOTE — Anesthesia Postprocedure Evaluation (Signed)
Anesthesia Post Note  Patient: Suzanne May  Procedure(s) Performed: INSERTION PORT-A-CATH ERAS PATHWAY (Left )     Patient location during evaluation: PACU Anesthesia Type: General Level of consciousness: awake and alert Pain management: pain level controlled Vital Signs Assessment: post-procedure vital signs reviewed and stable Respiratory status: spontaneous breathing, nonlabored ventilation, respiratory function stable and patient connected to nasal cannula oxygen Cardiovascular status: blood pressure returned to baseline and stable Postop Assessment: no apparent nausea or vomiting Anesthetic complications: no    Last Vitals:  Vitals:   09/08/17 1657 09/08/17 1715  BP:  (!) 156/100  Pulse: 68 67  Resp: 11 18  Temp:  (!) 36.3 C  SpO2: 100% 96%    Last Pain:  Vitals:   09/08/17 1715  TempSrc:   PainSc: 4                  Montez Hageman

## 2017-09-09 NOTE — Patient Instructions (Signed)
[facility] Discharge Instructions for Patients Receiving Chemotherapy  Today you received the following chemotherapy agents Gemcitabine and Carboplatin  To help prevent nausea and vomiting after your treatment, we encourage you to take your nausea medication as directed   If you develop nausea and vomiting that is not controlled by your nausea medication, call the clinic.   BELOW ARE SYMPTOMS THAT SHOULD BE REPORTED IMMEDIATELY:  *FEVER GREATER THAN 100.5 F  *CHILLS WITH OR WITHOUT FEVER  NAUSEA AND VOMITING THAT IS NOT CONTROLLED WITH YOUR NAUSEA MEDICATION  *UNUSUAL SHORTNESS OF BREATH  *UNUSUAL BRUISING OR BLEEDING  TENDERNESS IN MOUTH AND THROAT WITH OR WITHOUT PRESENCE OF ULCERS  *URINARY PROBLEMS  *BOWEL PROBLEMS  UNUSUAL RASH Items with * indicate a potential emergency and should be followed up as soon as possible.  Feel free to call the clinic should you have any questions or concerns. The clinic phone number is [phone].  Please show the [place] at check-in to the Emergency Department and triage nurse.  Carboplatin injection What is this medicine? CARBOPLATIN (KAR boe pla tin) is a chemotherapy drug. It targets fast dividing cells, like cancer cells, and causes these cells to die. This medicine is used to treat ovarian cancer and many other cancers. This medicine may be used for other purposes; ask your health care provider or pharmacist if you have questions. COMMON BRAND NAME(S): Paraplatin What should I tell my health care provider before I take this medicine? They need to know if you have any of these conditions: -blood disorders -hearing problems -kidney disease -recent or ongoing radiation therapy -an unusual or allergic reaction to carboplatin, cisplatin, other chemotherapy, other medicines, foods, dyes, or preservatives -pregnant or trying to get pregnant -breast-feeding How should I use this medicine? This drug is  usually given as an infusion into a vein. It is administered in a hospital or clinic by a specially trained health care professional. Talk to your pediatrician regarding the use of this medicine in children. Special care may be needed. Overdosage: If you think you have taken too much of this medicine contact a poison control center or emergency room at once. NOTE: This medicine is only for you. Do not share this medicine with others. What if I miss a dose? It is important not to miss a dose. Call your doctor or health care professional if you are unable to keep an appointment. What may interact with this medicine? -medicines for seizures -medicines to increase blood counts like filgrastim, pegfilgrastim, sargramostim -some antibiotics like amikacin, gentamicin, neomycin, streptomycin, tobramycin -vaccines Talk to your doctor or health care professional before taking any of these medicines: -acetaminophen -aspirin -ibuprofen -ketoprofen -naproxen This list may not describe all possible interactions. Give your health care provider a list of all the medicines, herbs, non-prescription drugs, or dietary supplements you use. Also tell them if you smoke, drink alcohol, or use illegal drugs. Some items may interact with your medicine. What should I watch for while using this medicine? Your condition will be monitored carefully while you are receiving this medicine. You will need important blood work done while you are taking this medicine. This drug may make you feel generally unwell. This is not uncommon, as chemotherapy can affect healthy cells as well as cancer cells. Report any side effects. Continue your course of treatment even though you feel ill unless your doctor tells you to stop. In some cases, you may be given additional medicines to help with side  effects. Follow all directions for their use. Call your doctor or health care professional for advice if you get a fever, chills or sore throat,  or other symptoms of a cold or flu. Do not treat yourself. This drug decreases your body's ability to fight infections. Try to avoid being around people who are sick. This medicine may increase your risk to bruise or bleed. Call your doctor or health care professional if you notice any unusual bleeding. Be careful brushing and flossing your teeth or using a toothpick because you may get an infection or bleed more easily. If you have any dental work done, tell your dentist you are receiving this medicine. Avoid taking products that contain aspirin, acetaminophen, ibuprofen, naproxen, or ketoprofen unless instructed by your doctor. These medicines may hide a fever. Do not become pregnant while taking this medicine. Women should inform their doctor if they wish to become pregnant or think they might be pregnant. There is a potential for serious side effects to an unborn child. Talk to your health care professional or pharmacist for more information. Do not breast-feed an infant while taking this medicine. What side effects may I notice from receiving this medicine? Side effects that you should report to your doctor or health care professional as soon as possible: -allergic reactions like skin rash, itching or hives, swelling of the face, lips, or tongue -signs of infection - fever or chills, cough, sore throat, pain or difficulty passing urine -signs of decreased platelets or bleeding - bruising, pinpoint red spots on the skin, black, tarry stools, nosebleeds -signs of decreased red blood cells - unusually weak or tired, fainting spells, lightheadedness -breathing problems -changes in hearing -changes in vision -chest pain -high blood pressure -low blood counts - This drug may decrease the number of white blood cells, red blood cells and platelets. You may be at increased risk for infections and bleeding. -nausea and vomiting -pain, swelling, redness or irritation at the injection site -pain,  tingling, numbness in the hands or feet -problems with balance, talking, walking -trouble passing urine or change in the amount of urine Side effects that usually do not require medical attention (report to your doctor or health care professional if they continue or are bothersome): -hair loss -loss of appetite -metallic taste in the mouth or changes in taste This list may not describe all possible side effects. Call your doctor for medical advice about side effects. You may report side effects to FDA at 1-800-FDA-1088. Where should I keep my medicine? This drug is given in a hospital or clinic and will not be stored at home. NOTE: This sheet is a summary. It may not cover all possible information. If you have questions about this medicine, talk to your doctor, pharmacist, or health care provider.  2018 Elsevier/Gold Standard (2008-02-13 14:38:05) Gemcitabine injection What is this medicine? GEMCITABINE (jem SIT a been) is a chemotherapy drug. This medicine is used to treat many types of cancer like breast cancer, lung cancer, pancreatic cancer, and ovarian cancer. This medicine may be used for other purposes; ask your health care provider or pharmacist if you have questions. COMMON BRAND NAME(S): Gemzar What should I tell my health care provider before I take this medicine? They need to know if you have any of these conditions: -blood disorders -infection -kidney disease -liver disease -recent or ongoing radiation therapy -an unusual or allergic reaction to gemcitabine, other chemotherapy, other medicines, foods, dyes, or preservatives -pregnant or trying to get pregnant -breast-feeding How should  I use this medicine? This drug is given as an infusion into a vein. It is administered in a hospital or clinic by a specially trained health care professional. Talk to your pediatrician regarding the use of this medicine in children. Special care may be needed. Overdosage: If you think you  have taken too much of this medicine contact a poison control center or emergency room at once. NOTE: This medicine is only for you. Do not share this medicine with others. What if I miss a dose? It is important not to miss your dose. Call your doctor or health care professional if you are unable to keep an appointment. What may interact with this medicine? -medicines to increase blood counts like filgrastim, pegfilgrastim, sargramostim -some other chemotherapy drugs like cisplatin -vaccines Talk to your doctor or health care professional before taking any of these medicines: -acetaminophen -aspirin -ibuprofen -ketoprofen -naproxen This list may not describe all possible interactions. Give your health care provider a list of all the medicines, herbs, non-prescription drugs, or dietary supplements you use. Also tell them if you smoke, drink alcohol, or use illegal drugs. Some items may interact with your medicine. What should I watch for while using this medicine? Visit your doctor for checks on your progress. This drug may make you feel generally unwell. This is not uncommon, as chemotherapy can affect healthy cells as well as cancer cells. Report any side effects. Continue your course of treatment even though you feel ill unless your doctor tells you to stop. In some cases, you may be given additional medicines to help with side effects. Follow all directions for their use. Call your doctor or health care professional for advice if you get a fever, chills or sore throat, or other symptoms of a cold or flu. Do not treat yourself. This drug decreases your body's ability to fight infections. Try to avoid being around people who are sick. This medicine may increase your risk to bruise or bleed. Call your doctor or health care professional if you notice any unusual bleeding. Be careful brushing and flossing your teeth or using a toothpick because you may get an infection or bleed more easily. If you  have any dental work done, tell your dentist you are receiving this medicine. Avoid taking products that contain aspirin, acetaminophen, ibuprofen, naproxen, or ketoprofen unless instructed by your doctor. These medicines may hide a fever. Women should inform their doctor if they wish to become pregnant or think they might be pregnant. There is a potential for serious side effects to an unborn child. Talk to your health care professional or pharmacist for more information. Do not breast-feed an infant while taking this medicine. What side effects may I notice from receiving this medicine? Side effects that you should report to your doctor or health care professional as soon as possible: -allergic reactions like skin rash, itching or hives, swelling of the face, lips, or tongue -low blood counts - this medicine may decrease the number of white blood cells, red blood cells and platelets. You may be at increased risk for infections and bleeding. -signs of infection - fever or chills, cough, sore throat, pain or difficulty passing urine -signs of decreased platelets or bleeding - bruising, pinpoint red spots on the skin, black, tarry stools, blood in the urine -signs of decreased red blood cells - unusually weak or tired, fainting spells, lightheadedness -breathing problems -chest pain -mouth sores -nausea and vomiting -pain, swelling, redness at site where injected -pain, tingling, numbness in  the hands or feet -stomach pain -swelling of ankles, feet, hands -unusual bleeding Side effects that usually do not require medical attention (report to your doctor or health care professional if they continue or are bothersome): -constipation -diarrhea -hair loss -loss of appetite -stomach upset This list may not describe all possible side effects. Call your doctor for medical advice about side effects. You may report side effects to FDA at 1-800-FDA-1088. Where should I keep my medicine? This drug is  given in a hospital or clinic and will not be stored at home. NOTE: This sheet is a summary. It may not cover all possible information. If you have questions about this medicine, talk to your doctor, pharmacist, or health care provider.  2018 Elsevier/Gold Standard (2008-03-19 18:45:54)

## 2017-09-12 ENCOUNTER — Other Ambulatory Visit: Payer: No Typology Code available for payment source

## 2017-09-12 MED ORDER — GADOBENATE DIMEGLUMINE 529 MG/ML IV SOLN
20.0000 mL | Freq: Once | INTRAVENOUS | Status: AC | PRN
  Administered 2017-09-12: 17 mL via INTRAVENOUS

## 2017-09-13 ENCOUNTER — Ambulatory Visit (HOSPITAL_COMMUNITY)
Admission: RE | Admit: 2017-09-13 | Discharge: 2017-09-13 | Disposition: A | Discharge status: Home / Self Care | Payer: Self-pay | Source: Ambulatory Visit | Attending: General Surgery | Admitting: General Surgery

## 2017-09-13 ENCOUNTER — Ambulatory Visit (HOSPITAL_COMMUNITY): Admission: RE | Admit: 2017-09-13 | Payer: Self-pay | Source: Ambulatory Visit

## 2017-09-13 DIAGNOSIS — R591 Generalized enlarged lymph nodes: Secondary | ICD-10-CM | POA: Insufficient documentation

## 2017-09-13 DIAGNOSIS — D259 Leiomyoma of uterus, unspecified: Secondary | ICD-10-CM | POA: Insufficient documentation

## 2017-09-13 DIAGNOSIS — C50512 Malignant neoplasm of lower-outer quadrant of left female breast: Secondary | ICD-10-CM | POA: Insufficient documentation

## 2017-09-13 DIAGNOSIS — E041 Nontoxic single thyroid nodule: Secondary | ICD-10-CM | POA: Insufficient documentation

## 2017-09-13 DIAGNOSIS — J432 Centrilobular emphysema: Secondary | ICD-10-CM | POA: Insufficient documentation

## 2017-09-13 MED ORDER — TECHNETIUM TC 99M MEDRONATE IV KIT: 25.0000 | PACK | Freq: Once | INTRAVENOUS | Status: DC | PRN

## 2017-09-13 MED ORDER — TECHNETIUM TC 99M MEDRONATE IV KIT
19.1000 | PACK | Freq: Once | INTRAVENOUS | Status: AC | PRN
  Administered 2017-09-13: 19.1 via INTRAVENOUS

## 2017-09-13 MED ORDER — HEPARIN SOD (PORK) LOCK FLUSH 100 UNIT/ML IV SOLN
INTRAVENOUS | Status: AC
  Filled 2017-09-13: qty 5

## 2017-09-13 MED ORDER — IOPAMIDOL (ISOVUE-300) INJECTION 61%
INTRAVENOUS | Status: AC
  Administered 2017-09-13: 100 mL
  Filled 2017-09-13: qty 100

## 2017-09-13 MED ORDER — HEPARIN SOD (PORK) LOCK FLUSH 100 UNIT/ML IV SOLN
500.0000 [IU] | Freq: Once | INTRAVENOUS | Status: AC
  Administered 2017-09-13: 500 [IU] via INTRAVENOUS

## 2017-09-15 ENCOUNTER — Telehealth: Payer: Self-pay | Admitting: *Deleted

## 2017-09-15 NOTE — Telephone Encounter (Signed)
"  I took a test on the 23 rd.  Calling to receive the results." Confirmed bone scan and CT of C/A/P are results asking for today.  Instructed to call ordering provider, Dr.Byerly.  Provided phone number at patient request..  Denies further questions or Medical Oncology needs at this time.
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
## 2017-09-15 NOTE — Assessment & Plan Note (Addendum)
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  Pathology and radiology counseling: Discussed with the patient, the details of pathology including the type of breast cancer,the clinical staging, the significance of ER, PR and HER-2/neu receptors and the implications for treatment. After reviewing the pathology in detail, we proceeded to discuss the different treatment options between surgery, radiation, chemotherapy, antiestrogen therapies.  Recommendation: 1. Neoadjuvant chemotherapy with Gemcitabine and Carboplatin started 09/09/2017 2. Followed by breast conserving surgery with targeted no dissection if possible 3. Followed by radiation  No evidence of distant metastasis on bone scan and CT chest, abdomen, and pelvis from 09/13/2017.  ------------------------------------------------------------------------------------------------------------------------  Lennix is doing well today.  She will proceed with chemotherapy.  I reviewed her scan results with her in detail.  I recommended short walks for 5-10 minutes per time to help with her fatigue.  She verbalizes understanding.  Carnita will return in 2 weeks for labs, f/u and cycle 2 of Gemcitabine and Carboplatin.

## 2017-09-16 ENCOUNTER — Telehealth: Payer: Self-pay | Admitting: Hematology and Oncology

## 2017-09-16 ENCOUNTER — Encounter: Payer: Self-pay | Admitting: Adult Health

## 2017-09-16 ENCOUNTER — Ambulatory Visit (HOSPITAL_BASED_OUTPATIENT_CLINIC_OR_DEPARTMENT_OTHER): Payer: Self-pay

## 2017-09-16 ENCOUNTER — Other Ambulatory Visit (HOSPITAL_BASED_OUTPATIENT_CLINIC_OR_DEPARTMENT_OTHER): Payer: Self-pay

## 2017-09-16 ENCOUNTER — Ambulatory Visit (HOSPITAL_BASED_OUTPATIENT_CLINIC_OR_DEPARTMENT_OTHER): Payer: Self-pay | Admitting: Adult Health

## 2017-09-16 ENCOUNTER — Telehealth: Payer: Self-pay | Admitting: *Deleted

## 2017-09-16 VITALS — BP 131/80 | HR 69 | Temp 98.4°F | Resp 18 | Ht 67.0 in | Wt 177.9 lb

## 2017-09-16 DIAGNOSIS — Z171 Estrogen receptor negative status [ER-]: Principal | ICD-10-CM

## 2017-09-16 DIAGNOSIS — C50512 Malignant neoplasm of lower-outer quadrant of left female breast: Secondary | ICD-10-CM

## 2017-09-16 DIAGNOSIS — Z452 Encounter for adjustment and management of vascular access device: Secondary | ICD-10-CM

## 2017-09-16 DIAGNOSIS — Z5111 Encounter for antineoplastic chemotherapy: Secondary | ICD-10-CM

## 2017-09-16 DIAGNOSIS — C773 Secondary and unspecified malignant neoplasm of axilla and upper limb lymph nodes: Secondary | ICD-10-CM

## 2017-09-16 LAB — COMPREHENSIVE METABOLIC PANEL
ALT: 21 U/L (ref 0–55)
ANION GAP: 7 meq/L (ref 3–11)
AST: 20 U/L (ref 5–34)
Albumin: 3.8 g/dL (ref 3.5–5.0)
Alkaline Phosphatase: 85 U/L (ref 40–150)
BUN: 7.5 mg/dL (ref 7.0–26.0)
CHLORIDE: 108 meq/L (ref 98–109)
CO2: 25 meq/L (ref 22–29)
CREATININE: 0.7 mg/dL (ref 0.6–1.1)
Calcium: 9.1 mg/dL (ref 8.4–10.4)
EGFR: >60 mL/min/{1.73_m2} (ref >60)
Glucose: 100 mg/dl (ref 70–140)
POTASSIUM: 3.7 meq/L (ref 3.5–5.1)
Sodium: 140 mEq/L (ref 136–145)
Total Bilirubin: 0.43 mg/dL (ref 0.20–1.20)
Total Protein: 7.1 g/dL (ref 6.4–8.3)

## 2017-09-16 LAB — CBC WITH DIFFERENTIAL/PLATELET
BASO%: 0.2 % (ref 0.0–2.0)
Basophils Absolute: 0 10*3/uL (ref 0.0–0.1)
EOS ABS: 0.1 10*3/uL (ref 0.0–0.5)
EOS%: 2.7 % (ref 0.0–7.0)
HCT: 38.6 % (ref 34.8–46.6)
HGB: 12.6 g/dL (ref 11.6–15.9)
LYMPH%: 41.8 % (ref 14.0–49.7)
MCH: 27.7 pg (ref 25.1–34.0)
MCHC: 32.6 g/dL (ref 31.5–36.0)
MCV: 84.8 fL (ref 79.5–101.0)
MONO#: 0.2 10*3/uL (ref 0.1–0.9)
MONO%: 3.5 % (ref 0.0–14.0)
NEUT%: 51.8 % (ref 38.4–76.8)
NEUTROS ABS: 2.5 10*3/uL (ref 1.5–6.5)
PLATELETS: 183 10*3/uL (ref 145–400)
RBC: 4.55 10*6/uL (ref 3.70–5.45)
RDW: 14.1 % (ref 11.2–14.5)
WBC: 4.9 10*3/uL (ref 3.9–10.3)
lymph#: 2 10*3/uL (ref 0.9–3.3)
nRBC: 0 % (ref 0–0)

## 2017-09-16 MED ORDER — ALTEPLASE 2 MG IJ SOLR
2.0000 mg | Freq: Once | INTRAMUSCULAR | Status: AC | PRN
  Administered 2017-09-16: 2 mg
  Filled 2017-09-16: qty 2

## 2017-09-16 MED ORDER — SODIUM CHLORIDE 0.9 % IV SOLN
Freq: Once | INTRAVENOUS | Status: AC
  Administered 2017-09-16: 16:00:00 via INTRAVENOUS

## 2017-09-16 MED ORDER — PALONOSETRON HCL INJECTION 0.25 MG/5ML
0.2500 mg | Freq: Once | INTRAVENOUS | Status: AC
  Administered 2017-09-16: 0.25 mg via INTRAVENOUS

## 2017-09-16 MED ORDER — DEXAMETHASONE SODIUM PHOSPHATE 10 MG/ML IJ SOLN
10.0000 mg | Freq: Once | INTRAMUSCULAR | Status: AC
  Administered 2017-09-16: 10 mg via INTRAVENOUS

## 2017-09-16 MED ORDER — PALONOSETRON HCL INJECTION 0.25 MG/5ML
INTRAVENOUS | Status: AC
  Filled 2017-09-16: qty 5

## 2017-09-16 MED ORDER — HEPARIN SOD (PORK) LOCK FLUSH 100 UNIT/ML IV SOLN
500.0000 [IU] | Freq: Once | INTRAVENOUS | Status: AC | PRN
  Administered 2017-09-16: 500 [IU]
  Filled 2017-09-16: qty 5

## 2017-09-16 MED ORDER — ALTEPLASE 2 MG IJ SOLR
2.0000 mg | Freq: Once | INTRAMUSCULAR | Status: AC
  Administered 2017-09-16: 2 mg
  Filled 2017-09-16: qty 2

## 2017-09-16 MED ORDER — DEXAMETHASONE SODIUM PHOSPHATE 10 MG/ML IJ SOLN
INTRAMUSCULAR | Status: AC
  Filled 2017-09-16: qty 1

## 2017-09-16 MED ORDER — SODIUM CHLORIDE 0.9 % IV SOLN
258.8000 mg | Freq: Once | INTRAVENOUS | Status: AC
  Administered 2017-09-16: 260 mg via INTRAVENOUS
  Filled 2017-09-16: qty 26

## 2017-09-16 MED ORDER — SODIUM CHLORIDE 0.9 % IV SOLN
1000.0000 mg/m2 | Freq: Once | INTRAVENOUS | Status: AC
  Administered 2017-09-16: 1976 mg via INTRAVENOUS
  Filled 2017-09-16: qty 51.97

## 2017-09-16 MED ORDER — SODIUM CHLORIDE 0.9% FLUSH
10.0000 mL | INTRAVENOUS | Status: DC | PRN
  Administered 2017-09-16: 10 mL
  Filled 2017-09-16: qty 10

## 2017-09-16 NOTE — Telephone Encounter (Signed)
Per review with MD per need for repeated cath flo due to no blood return in treatment room despite positioning.  Per Dr Jana Hakim may repeat Cath Flo for IV therapy today.

## 2017-09-16 NOTE — Patient Instructions (Signed)
[facility] Discharge Instructions for Patients Receiving Chemotherapy  Today you received the following chemotherapy agents Gemzar and Carboplatin   To help prevent nausea and vomiting after your treatment, we encourage you to take your nausea medication as directed.    If you develop nausea and vomiting that is not controlled by your nausea medication, call the clinic.   BELOW ARE SYMPTOMS THAT SHOULD BE REPORTED IMMEDIATELY:  *FEVER GREATER THAN 100.5 F  *CHILLS WITH OR WITHOUT FEVER  NAUSEA AND VOMITING THAT IS NOT CONTROLLED WITH YOUR NAUSEA MEDICATION  *UNUSUAL SHORTNESS OF BREATH  *UNUSUAL BRUISING OR BLEEDING  TENDERNESS IN MOUTH AND THROAT WITH OR WITHOUT PRESENCE OF ULCERS  *URINARY PROBLEMS  *BOWEL PROBLEMS  UNUSUAL RASH Items with * indicate a potential emergency and should be followed up as soon as possible.  Feel free to call the clinic should you have any questions or concerns. The clinic phone number is [phone].  Please show the CHEMO ALERT CARD at check-in to the Emergency Department and triage nurse.   

## 2017-09-16 NOTE — Telephone Encounter (Signed)
Gave patient calendar of upcoming November and December appointments. Patient declined AVS.

## 2017-09-20 ENCOUNTER — Encounter (HOSPITAL_COMMUNITY): Payer: Self-pay | Admitting: General Surgery

## 2017-09-20 NOTE — Progress Notes (Signed)
Please let patient know that the only cancer seen on the scans is in the breast and axilla.

## 2017-09-30 ENCOUNTER — Other Ambulatory Visit: Payer: No Typology Code available for payment source

## 2017-09-30 ENCOUNTER — Ambulatory Visit: Payer: No Typology Code available for payment source

## 2017-09-30 ENCOUNTER — Ambulatory Visit: Payer: Self-pay

## 2017-09-30 ENCOUNTER — Other Ambulatory Visit (HOSPITAL_BASED_OUTPATIENT_CLINIC_OR_DEPARTMENT_OTHER): Payer: Self-pay

## 2017-09-30 ENCOUNTER — Ambulatory Visit (HOSPITAL_BASED_OUTPATIENT_CLINIC_OR_DEPARTMENT_OTHER): Payer: Self-pay | Admitting: Adult Health

## 2017-09-30 ENCOUNTER — Encounter: Payer: Self-pay | Admitting: Adult Health

## 2017-09-30 ENCOUNTER — Ambulatory Visit (HOSPITAL_BASED_OUTPATIENT_CLINIC_OR_DEPARTMENT_OTHER): Payer: Self-pay

## 2017-09-30 VITALS — BP 114/89 | HR 78 | Temp 98.1°F | Resp 18 | Ht 67.0 in | Wt 176.2 lb

## 2017-09-30 DIAGNOSIS — Z171 Estrogen receptor negative status [ER-]: Secondary | ICD-10-CM

## 2017-09-30 DIAGNOSIS — C50512 Malignant neoplasm of lower-outer quadrant of left female breast: Secondary | ICD-10-CM

## 2017-09-30 DIAGNOSIS — Z5111 Encounter for antineoplastic chemotherapy: Secondary | ICD-10-CM

## 2017-09-30 DIAGNOSIS — R5383 Other fatigue: Secondary | ICD-10-CM

## 2017-09-30 DIAGNOSIS — Z95828 Presence of other vascular implants and grafts: Secondary | ICD-10-CM

## 2017-09-30 DIAGNOSIS — C773 Secondary and unspecified malignant neoplasm of axilla and upper limb lymph nodes: Secondary | ICD-10-CM

## 2017-09-30 LAB — CBC WITH DIFFERENTIAL/PLATELET
BASO%: 0.2 % (ref 0.0–2.0)
BASOS ABS: 0 10*3/uL (ref 0.0–0.1)
EOS ABS: 0.1 10*3/uL (ref 0.0–0.5)
EOS%: 1.1 % (ref 0.0–7.0)
HEMATOCRIT: 39.3 % (ref 34.8–46.6)
HEMOGLOBIN: 12.8 g/dL (ref 11.6–15.9)
LYMPH#: 2.5 10*3/uL (ref 0.9–3.3)
LYMPH%: 40 % (ref 14.0–49.7)
MCH: 27.6 pg (ref 25.1–34.0)
MCHC: 32.6 g/dL (ref 31.5–36.0)
MCV: 84.7 fL (ref 79.5–101.0)
MONO#: 0.6 10*3/uL (ref 0.1–0.9)
MONO%: 9.5 % (ref 0.0–14.0)
NEUT%: 49.2 % (ref 38.4–76.8)
NEUTROS ABS: 3.1 10*3/uL (ref 1.5–6.5)
Platelets: 300 10*3/uL (ref 145–400)
RBC: 4.64 10*6/uL (ref 3.70–5.45)
RDW: 14.6 % — AB (ref 11.2–14.5)
WBC: 6.3 10*3/uL (ref 3.9–10.3)

## 2017-09-30 LAB — COMPREHENSIVE METABOLIC PANEL
ALT: 26 U/L (ref 0–55)
ANION GAP: 9 meq/L (ref 3–11)
AST: 22 U/L (ref 5–34)
Albumin: 4 g/dL (ref 3.5–5.0)
Alkaline Phosphatase: 87 U/L (ref 40–150)
BUN: 7.9 mg/dL (ref 7.0–26.0)
CALCIUM: 9.5 mg/dL (ref 8.4–10.4)
CHLORIDE: 106 meq/L (ref 98–109)
CO2: 23 meq/L (ref 22–29)
Creatinine: 0.7 mg/dL (ref 0.6–1.1)
Glucose: 83 mg/dl (ref 70–140)
POTASSIUM: 4.2 meq/L (ref 3.5–5.1)
Sodium: 138 mEq/L (ref 136–145)
Total Bilirubin: 0.55 mg/dL (ref 0.20–1.20)
Total Protein: 7.2 g/dL (ref 6.4–8.3)

## 2017-09-30 LAB — TSH: TSH: 0.59 m[IU]/L (ref 0.308–3.960)

## 2017-09-30 MED ORDER — GEMCITABINE HCL CHEMO INJECTION 1 GM/26.3ML
1000.0000 mg/m2 | Freq: Once | INTRAVENOUS | Status: AC
  Administered 2017-09-30: 1976 mg via INTRAVENOUS
  Filled 2017-09-30: qty 51.97

## 2017-09-30 MED ORDER — PALONOSETRON HCL INJECTION 0.25 MG/5ML
0.2500 mg | Freq: Once | INTRAVENOUS | Status: AC
  Administered 2017-09-30: 0.25 mg via INTRAVENOUS

## 2017-09-30 MED ORDER — SODIUM CHLORIDE 0.9% FLUSH
10.0000 mL | INTRAVENOUS | Status: DC | PRN
  Administered 2017-09-30: 10 mL
  Filled 2017-09-30: qty 10

## 2017-09-30 MED ORDER — HEPARIN SOD (PORK) LOCK FLUSH 100 UNIT/ML IV SOLN
500.0000 [IU] | Freq: Once | INTRAVENOUS | Status: AC | PRN
  Administered 2017-09-30: 500 [IU]
  Filled 2017-09-30: qty 5

## 2017-09-30 MED ORDER — SODIUM CHLORIDE 0.9 % IV SOLN
258.8000 mg | Freq: Once | INTRAVENOUS | Status: AC
  Administered 2017-09-30: 260 mg via INTRAVENOUS
  Filled 2017-09-30: qty 26

## 2017-09-30 MED ORDER — PALONOSETRON HCL INJECTION 0.25 MG/5ML
INTRAVENOUS | Status: AC
  Filled 2017-09-30: qty 5

## 2017-09-30 MED ORDER — SODIUM CHLORIDE 0.9% FLUSH
10.0000 mL | Freq: Once | INTRAVENOUS | Status: AC
  Administered 2017-09-30: 10 mL
  Filled 2017-09-30: qty 10

## 2017-09-30 MED ORDER — DEXAMETHASONE SODIUM PHOSPHATE 10 MG/ML IJ SOLN
INTRAMUSCULAR | Status: AC
  Filled 2017-09-30: qty 1

## 2017-09-30 MED ORDER — DEXAMETHASONE SODIUM PHOSPHATE 10 MG/ML IJ SOLN
10.0000 mg | Freq: Once | INTRAMUSCULAR | Status: AC
  Administered 2017-09-30: 10 mg via INTRAVENOUS

## 2017-09-30 MED ORDER — SODIUM CHLORIDE 0.9 % IV SOLN
Freq: Once | INTRAVENOUS | Status: AC
  Administered 2017-09-30: 14:00:00 via INTRAVENOUS
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
## 2017-09-30 NOTE — Patient Instructions (Signed)
   [facility] Discharge Instructions for Patients Receiving Chemotherapy  Today you received the following chemotherapy agents Gemcitabine and Carboplatin  To help prevent nausea and vomiting after your treatment, we encourage you to take your nausea medication as directed   If you develop nausea and vomiting that is not controlled by your nausea medication, call the clinic.   BELOW ARE SYMPTOMS THAT SHOULD BE REPORTED IMMEDIATELY:  *FEVER GREATER THAN 100.5 F  *CHILLS WITH OR WITHOUT FEVER  NAUSEA AND VOMITING THAT IS NOT CONTROLLED WITH YOUR NAUSEA MEDICATION  *UNUSUAL SHORTNESS OF BREATH  *UNUSUAL BRUISING OR BLEEDING  TENDERNESS IN MOUTH AND THROAT WITH OR WITHOUT PRESENCE OF ULCERS  *URINARY PROBLEMS  *BOWEL PROBLEMS  UNUSUAL RASH Items with * indicate a potential emergency and should be followed up as soon as possible.  Feel free to call the clinic should you have any questions or concerns. The clinic phone number is [phone].  Please show the [facility] at check-in to the Emergency Department and triage nurse.

## 2017-09-30 NOTE — Assessment & Plan Note (Addendum)
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  Pathology and radiology counseling: Discussed with the patient, the details of pathology including the type of breast cancer,the clinical staging, the significance of ER, PR and HER-2/neu receptors and the implications for treatment. After reviewing the pathology in detail, we proceeded to discuss the different treatment options between surgery, radiation, chemotherapy, antiestrogen therapies.  Recommendation: 1. Neoadjuvant chemotherapy with Gemcitabine and Carboplatin started 09/09/2017 2. Followed by breast conserving surgery with targeted no dissection if possible 3. Followed by radiation  No evidence of distant metastasis on bone scan and CT chest, abdomen, and pelvis from 09/13/2017.  ------------------------------------------------------------------------------------------------------------------------  Suzanne May is doing moderately well today.  She is tolerating the chemotherapy without difficulty.  I added on a thyroid panel due to her fatigue.  We discussed activity management.  She will return in one week for labs and day 8 Gemcitabine/Carbo.  She will return in 3 weeks for labs, f/u with Dr. Lindi Adie and cycle 3.  She is tolerating it well.

## 2017-09-30 NOTE — Patient Instructions (Signed)
Implanted Port Home Guide An implanted port is a type of central line that is placed under the skin. Central lines are used to provide IV access when treatment or nutrition needs to be given through a person's veins. Implanted ports are used for long-term IV access. An implanted port may be placed because:  You need IV medicine that would be irritating to the small veins in your hands or arms.  You need long-term IV medicines, such as antibiotics.  You need IV nutrition for a long period.  You need frequent blood draws for lab tests.  You need dialysis.  Implanted ports are usually placed in the chest area, but they can also be placed in the upper arm, the abdomen, or the leg. An implanted port has two main parts:  Reservoir. The reservoir is round and will appear as a small, raised area under your skin. The reservoir is the part where a needle is inserted to give medicines or draw blood.  Catheter. The catheter is a thin, flexible tube that extends from the reservoir. The catheter is placed into a large vein. Medicine that is inserted into the reservoir goes into the catheter and then into the vein.  How will I care for my incision site? Do not get the incision site wet. Bathe or shower as directed by your health care provider. How is my port accessed? Special steps must be taken to access the port:  Before the port is accessed, a numbing cream can be placed on the skin. This helps numb the skin over the port site.  Your health care provider uses a sterile technique to access the port. ? Your health care provider must put on a mask and sterile gloves. ? The skin over your port is cleaned carefully with an antiseptic and allowed to dry. ? The port is gently pinched between sterile gloves, and a needle is inserted into the port.  Only "non-coring" port needles should be used to access the port. Once the port is accessed, a blood return should be checked. This helps ensure that the port  is in the vein and is not clogged.  If your port needs to remain accessed for a constant infusion, a clear (transparent) bandage will be placed over the needle site. The bandage and needle will need to be changed every week, or as directed by your health care provider.  Keep the bandage covering the needle clean and dry. Do not get it wet. Follow your health care provider's instructions on how to take a shower or bath while the port is accessed.  If your port does not need to stay accessed, no bandage is needed over the port.  What is flushing? Flushing helps keep the port from getting clogged. Follow your health care provider's instructions on how and when to flush the port. Ports are usually flushed with saline solution or a medicine called heparin. The need for flushing will depend on how the port is used.  If the port is used for intermittent medicines or blood draws, the port will need to be flushed: ? After medicines have been given. ? After blood has been drawn. ? As part of routine maintenance.  If a constant infusion is running, the port may not need to be flushed.  How long will my port stay implanted? The port can stay in for as long as your health care provider thinks it is needed. When it is time for the port to come out, surgery will be   done to remove it. The procedure is similar to the one performed when the port was put in. When should I seek immediate medical care? When you have an implanted port, you should seek immediate medical care if:  You notice a bad smell coming from the incision site.  You have swelling, redness, or drainage at the incision site.  You have more swelling or pain at the port site or the surrounding area.  You have a fever that is not controlled with medicine.  This information is not intended to replace advice given to you by your health care provider. Make sure you discuss any questions you have with your health care provider. Document  Released: 11/08/2005 Document Revised: 04/15/2016 Document Reviewed: 07/16/2013 Elsevier Interactive Patient Education  2017 [employer]  

## 2017-10-01 LAB — T4: T4 TOTAL: 7.2 ug/dL (ref 4.5–12.0)

## 2017-10-01 LAB — T3 UPTAKE
Free Thyroxine Index: 1.9 (ref 1.2–4.9)
T3 UPTAKE RATIO: 26 % (ref 24–39)

## 2017-10-01 LAB — T4, FREE: FREE T4: 1.16 ng/dL (ref 0.82–1.77)

## 2017-10-05 NOTE — Progress Notes (Signed)
Called pt to notify her of her normal thyroid levels. Pt verbalized understanding and has no further questions at this time. Pt thankful for the call.

## 2017-10-07 ENCOUNTER — Ambulatory Visit (HOSPITAL_BASED_OUTPATIENT_CLINIC_OR_DEPARTMENT_OTHER): Payer: Self-pay

## 2017-10-07 ENCOUNTER — Ambulatory Visit: Payer: No Typology Code available for payment source

## 2017-10-07 ENCOUNTER — Other Ambulatory Visit (HOSPITAL_BASED_OUTPATIENT_CLINIC_OR_DEPARTMENT_OTHER): Payer: No Typology Code available for payment source

## 2017-10-07 VITALS — BP 125/96 | HR 112 | Temp 98.2°F | Resp 18

## 2017-10-07 DIAGNOSIS — C50512 Malignant neoplasm of lower-outer quadrant of left female breast: Secondary | ICD-10-CM

## 2017-10-07 DIAGNOSIS — Z171 Estrogen receptor negative status [ER-]: Principal | ICD-10-CM

## 2017-10-07 DIAGNOSIS — Z95828 Presence of other vascular implants and grafts: Secondary | ICD-10-CM

## 2017-10-07 DIAGNOSIS — Z5111 Encounter for antineoplastic chemotherapy: Secondary | ICD-10-CM

## 2017-10-07 LAB — CBC WITH DIFFERENTIAL/PLATELET
BASO%: 0.4 % (ref 0.0–2.0)
BASOS ABS: 0 10*3/uL (ref 0.0–0.1)
EOS%: 0.4 % (ref 0.0–7.0)
Eosinophils Absolute: 0 10*3/uL (ref 0.0–0.5)
HCT: 36.6 % (ref 34.8–46.6)
HGB: 12.1 g/dL (ref 11.6–15.9)
LYMPH%: 55.5 % — ABNORMAL HIGH (ref 14.0–49.7)
MCH: 27.8 pg (ref 25.1–34.0)
MCHC: 33.1 g/dL (ref 31.5–36.0)
MCV: 83.9 fL (ref 79.5–101.0)
MONO#: 0.2 10*3/uL (ref 0.1–0.9)
MONO%: 3 % (ref 0.0–14.0)
NEUT#: 2.2 10*3/uL (ref 1.5–6.5)
NEUT%: 40.7 % (ref 38.4–76.8)
NRBC: 0 % (ref 0–0)
Platelets: 287 10*3/uL (ref 145–400)
RBC: 4.36 10*6/uL (ref 3.70–5.45)
RDW: 14.8 % — AB (ref 11.2–14.5)
WBC: 5.4 10*3/uL (ref 3.9–10.3)
lymph#: 3 10*3/uL (ref 0.9–3.3)

## 2017-10-07 LAB — COMPREHENSIVE METABOLIC PANEL
ALBUMIN: 4 g/dL (ref 3.5–5.0)
ALT: 49 U/L (ref 0–55)
ANION GAP: 9 meq/L (ref 3–11)
AST: 27 U/L (ref 5–34)
Alkaline Phosphatase: 103 U/L (ref 40–150)
BILIRUBIN TOTAL: 0.29 mg/dL (ref 0.20–1.20)
BUN: 6.5 mg/dL — ABNORMAL LOW (ref 7.0–26.0)
CO2: 24 meq/L (ref 22–29)
CREATININE: 0.8 mg/dL (ref 0.6–1.1)
Calcium: 9.5 mg/dL (ref 8.4–10.4)
Chloride: 105 mEq/L (ref 98–109)
EGFR: >60 mL/min/{1.73_m2} (ref >60)
GLUCOSE: 114 mg/dL (ref 70–140)
Potassium: 3.8 mEq/L (ref 3.5–5.1)
Sodium: 138 mEq/L (ref 136–145)
TOTAL PROTEIN: 7.3 g/dL (ref 6.4–8.3)

## 2017-10-07 MED ORDER — DEXAMETHASONE SODIUM PHOSPHATE 10 MG/ML IJ SOLN
INTRAMUSCULAR | Status: AC
  Filled 2017-10-07: qty 1

## 2017-10-07 MED ORDER — PALONOSETRON HCL INJECTION 0.25 MG/5ML
INTRAVENOUS | Status: AC
  Filled 2017-10-07: qty 5

## 2017-10-07 MED ORDER — SODIUM CHLORIDE 0.9 % IV SOLN
1000.0000 mg/m2 | Freq: Once | INTRAVENOUS | Status: AC
  Administered 2017-10-07: 1976 mg via INTRAVENOUS
  Filled 2017-10-07: qty 51.97

## 2017-10-07 MED ORDER — DEXAMETHASONE SODIUM PHOSPHATE 10 MG/ML IJ SOLN
10.0000 mg | Freq: Once | INTRAMUSCULAR | Status: AC
  Administered 2017-10-07: 10 mg via INTRAVENOUS

## 2017-10-07 MED ORDER — SODIUM CHLORIDE 0.9 % IV SOLN
Freq: Once | INTRAVENOUS | Status: AC
  Administered 2017-10-07: 14:00:00 via INTRAVENOUS

## 2017-10-07 MED ORDER — SODIUM CHLORIDE 0.9 % IV SOLN
256.4000 mg | Freq: Once | INTRAVENOUS | Status: AC
  Administered 2017-10-07: 260 mg via INTRAVENOUS
  Filled 2017-10-07: qty 26

## 2017-10-07 MED ORDER — PALONOSETRON HCL INJECTION 0.25 MG/5ML
0.2500 mg | Freq: Once | INTRAVENOUS | Status: AC
  Administered 2017-10-07: 0.25 mg via INTRAVENOUS

## 2017-10-07 MED ORDER — HEPARIN SOD (PORK) LOCK FLUSH 100 UNIT/ML IV SOLN
500.0000 [IU] | Freq: Once | INTRAVENOUS | Status: AC | PRN
  Administered 2017-10-07: 500 [IU]
  Filled 2017-10-07: qty 5

## 2017-10-07 MED ORDER — SODIUM CHLORIDE 0.9% FLUSH
10.0000 mL | INTRAVENOUS | Status: DC | PRN
  Administered 2017-10-07: 10 mL
  Filled 2017-10-07: qty 10

## 2017-10-07 MED ORDER — SODIUM CHLORIDE 0.9% FLUSH
10.0000 mL | Freq: Once | INTRAVENOUS | Status: AC
  Administered 2017-10-07: 10 mL
  Filled 2017-10-07: qty 10

## 2017-10-07 NOTE — Progress Notes (Signed)
Pt states she is having diarrhea ~ 4 times per day. When asked how long she has been having diarrhea, stated "pretty much the whole time of treatment". States she has not mentioned this to Dr Lindi Adie or Mendel Ryder because she feels it is good to "void it all out". Clarified voiding vs BM.Marland Kitchen Discussed use of OTC imodium to decrease diarrhea. Reinforced calling us if she feels she is getting dehydrated.

## 2017-10-07 NOTE — Patient Instructions (Signed)
[facility] Discharge Instructions for Patients Receiving Chemotherapy  Today you received the following chemotherapy agents Carboplatin, Gemzar  To help prevent nausea and vomiting after your treatment, we encourage you to take your nausea medication as needed   If you develop nausea and vomiting that is not controlled by your nausea medication, call the clinic.   BELOW ARE SYMPTOMS THAT SHOULD BE REPORTED IMMEDIATELY:  *FEVER GREATER THAN 100.5 F  *CHILLS WITH OR WITHOUT FEVER  NAUSEA AND VOMITING THAT IS NOT CONTROLLED WITH YOUR NAUSEA MEDICATION  *UNUSUAL SHORTNESS OF BREATH  *UNUSUAL BRUISING OR BLEEDING  TENDERNESS IN MOUTH AND THROAT WITH OR WITHOUT PRESENCE OF ULCERS  *URINARY PROBLEMS  *BOWEL PROBLEMS  UNUSUAL RASH Items with * indicate a potential emergency and should be followed up as soon as possible.  Feel free to call the clinic should you have any questions or concerns. The clinic phone number is [phone].  Please show the [facility] at check-in to the Emergency Department and triage nurse.

## 2017-10-21 ENCOUNTER — Other Ambulatory Visit (HOSPITAL_BASED_OUTPATIENT_CLINIC_OR_DEPARTMENT_OTHER): Payer: Self-pay

## 2017-10-21 ENCOUNTER — Ambulatory Visit (HOSPITAL_BASED_OUTPATIENT_CLINIC_OR_DEPARTMENT_OTHER): Payer: Self-pay | Admitting: Hematology and Oncology

## 2017-10-21 ENCOUNTER — Ambulatory Visit: Payer: Self-pay

## 2017-10-21 ENCOUNTER — Ambulatory Visit (HOSPITAL_BASED_OUTPATIENT_CLINIC_OR_DEPARTMENT_OTHER): Payer: Self-pay

## 2017-10-21 DIAGNOSIS — C50512 Malignant neoplasm of lower-outer quadrant of left female breast: Secondary | ICD-10-CM

## 2017-10-21 DIAGNOSIS — Z171 Estrogen receptor negative status [ER-]: Principal | ICD-10-CM

## 2017-10-21 DIAGNOSIS — F419 Anxiety disorder, unspecified: Secondary | ICD-10-CM

## 2017-10-21 DIAGNOSIS — Z5111 Encounter for antineoplastic chemotherapy: Secondary | ICD-10-CM

## 2017-10-21 DIAGNOSIS — R5383 Other fatigue: Secondary | ICD-10-CM

## 2017-10-21 DIAGNOSIS — C773 Secondary and unspecified malignant neoplasm of axilla and upper limb lymph nodes: Secondary | ICD-10-CM

## 2017-10-21 DIAGNOSIS — Z95828 Presence of other vascular implants and grafts: Secondary | ICD-10-CM

## 2017-10-21 LAB — COMPREHENSIVE METABOLIC PANEL
ALT: 41 U/L (ref 0–55)
AST: 28 U/L (ref 5–34)
Albumin: 4 g/dL (ref 3.5–5.0)
Alkaline Phosphatase: 82 U/L (ref 40–150)
Anion Gap: 9 mEq/L (ref 3–11)
BILIRUBIN TOTAL: 0.33 mg/dL (ref 0.20–1.20)
BUN: 4.8 mg/dL — AB (ref 7.0–26.0)
CHLORIDE: 108 meq/L (ref 98–109)
CO2: 23 meq/L (ref 22–29)
CREATININE: 0.8 mg/dL (ref 0.6–1.1)
Calcium: 9 mg/dL (ref 8.4–10.4)
EGFR: >60 mL/min/{1.73_m2} (ref >60)
GLUCOSE: 81 mg/dL (ref 70–140)
Potassium: 3.8 mEq/L (ref 3.5–5.1)
SODIUM: 140 meq/L (ref 136–145)
TOTAL PROTEIN: 7.3 g/dL (ref 6.4–8.3)

## 2017-10-21 LAB — CBC WITH DIFFERENTIAL/PLATELET
BASO%: 0.2 % (ref 0.0–2.0)
BASOS ABS: 0 10*3/uL (ref 0.0–0.1)
EOS ABS: 0.1 10*3/uL (ref 0.0–0.5)
EOS%: 1.5 % (ref 0.0–7.0)
HCT: 35.1 % (ref 34.8–46.6)
HEMOGLOBIN: 11.5 g/dL — AB (ref 11.6–15.9)
LYMPH%: 35.9 % (ref 14.0–49.7)
MCH: 27.6 pg (ref 25.1–34.0)
MCHC: 32.8 g/dL (ref 31.5–36.0)
MCV: 84.4 fL (ref 79.5–101.0)
MONO#: 0.7 10*3/uL (ref 0.1–0.9)
MONO%: 12.5 % (ref 0.0–14.0)
NEUT#: 2.7 10*3/uL (ref 1.5–6.5)
NEUT%: 49.9 % (ref 38.4–76.8)
Platelets: 194 10*3/uL (ref 145–400)
RBC: 4.16 10*6/uL (ref 3.70–5.45)
RDW: 16.1 % — ABNORMAL HIGH (ref 11.2–14.5)
WBC: 5.4 10*3/uL (ref 3.9–10.3)
lymph#: 1.9 10*3/uL (ref 0.9–3.3)

## 2017-10-21 MED ORDER — PALONOSETRON HCL INJECTION 0.25 MG/5ML
INTRAVENOUS | Status: AC
  Filled 2017-10-21: qty 5

## 2017-10-21 MED ORDER — SODIUM CHLORIDE 0.9% FLUSH
10.0000 mL | INTRAVENOUS | Status: DC | PRN
  Administered 2017-10-21: 10 mL
  Filled 2017-10-21: qty 10

## 2017-10-21 MED ORDER — DEXAMETHASONE SODIUM PHOSPHATE 10 MG/ML IJ SOLN
INTRAMUSCULAR | Status: AC
  Filled 2017-10-21: qty 1

## 2017-10-21 MED ORDER — HEPARIN SOD (PORK) LOCK FLUSH 100 UNIT/ML IV SOLN
500.0000 [IU] | Freq: Once | INTRAVENOUS | Status: AC | PRN
  Administered 2017-10-21: 500 [IU]
  Filled 2017-10-21: qty 5

## 2017-10-21 MED ORDER — SODIUM CHLORIDE 0.9 % IV SOLN
Freq: Once | INTRAVENOUS | Status: AC
  Administered 2017-10-21: 13:00:00 via INTRAVENOUS

## 2017-10-21 MED ORDER — DEXAMETHASONE SODIUM PHOSPHATE 10 MG/ML IJ SOLN
10.0000 mg | Freq: Once | INTRAMUSCULAR | Status: AC
  Administered 2017-10-21: 10 mg via INTRAVENOUS

## 2017-10-21 MED ORDER — SODIUM CHLORIDE 0.9 % IV SOLN
1000.0000 mg/m2 | Freq: Once | INTRAVENOUS | Status: AC
  Administered 2017-10-21: 1976 mg via INTRAVENOUS
  Filled 2017-10-21: qty 51.97

## 2017-10-21 MED ORDER — SODIUM CHLORIDE 0.9% FLUSH
10.0000 mL | Freq: Once | INTRAVENOUS | Status: AC
Start: 2017-10-21 — End: 2017-10-21
  Administered 2017-10-21: 10 mL
  Filled 2017-10-21: qty 10

## 2017-10-21 MED ORDER — CARBOPLATIN CHEMO INJECTION 450 MG/45ML
256.4000 mg | Freq: Once | INTRAVENOUS | Status: AC
  Administered 2017-10-21: 260 mg via INTRAVENOUS
  Filled 2017-10-21: qty 26

## 2017-10-21 MED ORDER — PALONOSETRON HCL INJECTION 0.25 MG/5ML
0.2500 mg | Freq: Once | INTRAVENOUS | Status: AC
  Administered 2017-10-21: 0.25 mg via INTRAVENOUS

## 2017-10-21 NOTE — Progress Notes (Signed)
Patient Care Team: Patient, No Pcp Per as PCP - General (General Practice) Stark Klein, MD as Consulting Physician (General Surgery)  DIAGNOSIS:  Encounter Diagnosis  Name Primary?  . Malignant neoplasm of lower-outer quadrant of left breast of female, estrogen receptor negative ([place])     SUMMARY OF ONCOLOGIC HISTORY:   Malignant neoplasm of lower-outer quadrant of left breast of female, estrogen receptor negative ([place])   2003 Initial Biopsy    Right breast cancer diagnosed and treated at [facility] hormone receptor positive, 30 positive axillary lymph nodes modified radical mastectomy followed by chemotherapy, did not take radiation on antiestrogen therapy      08/29/2017 Initial Diagnosis    Left breast 5:00 position: 2.7 cm mass, left axillary LN 3.5 cm, adjacent lymph node 0.9 cm, third lymph node 1.1 cm; Biopsy IDC grade 3 with DCIS, lymph node biopsy positive for metastatic ductal carcinoma, ER 0%, PR 0%,HER-2 negative ratio 1.28, stage IIIB AJCC 8      09/09/2017 -  Neo-Adjuvant Chemotherapy    Gemcitabine/Carboplatin given on day 1 and day 8 of a 21 day cycle       CHIEF COMPLIANT: Cycle 3 neoadjuvant carboplatin and gemcitabine  INTERVAL HISTORY: Suzanne May is a 56 year old with above-mentioned history of left breast cancer currently on neoadjuvant chemotherapy with carboplatin and gemcitabine.  Today cycle 3.  She had fatigue and mild nausea after each cycle.  She is also felt some neuropathy in the fingers but it has improved.  Mild hair loss.  REVIEW OF SYSTEMS:   Constitutional: Denies fevers, chills or abnormal weight loss Eyes: Denies blurriness of vision Ears, nose, mouth, throat, and face: Denies mucositis or sore throat Respiratory: Denies cough, dyspnea or wheezes Cardiovascular: Denies palpitation, chest discomfort Gastrointestinal:  Denies nausea, heartburn or change in bowel habits Skin: Denies abnormal skin rashes Lymphatics: Denies new  lymphadenopathy or easy bruising Neurological:Denies numbness, tingling or new weaknesses Behavioral/Psych: Severely stressed Extremities: No lower extremity edema Breast: Breast lump appears to be waxing and waning. All other systems were reviewed with the patient and are negative.  I have reviewed the past medical history, past surgical history, social history and family history with the patient and they are unchanged from previous note.  ALLERGIES:  is allergic to olive oil and penicillins.  MEDICATIONS:  Current Outpatient Medications  Medication Sig Dispense Refill  . lidocaine-prilocaine (EMLA) cream Apply to affected area once 30 g 3  . naproxen sodium (ANAPROX) 220 MG tablet Take 220-440 mg by mouth daily as needed (headache).    . ondansetron (ZOFRAN) 8 MG tablet Take 1 tablet (8 mg total) by mouth 2 (two) times daily as needed for refractory nausea / vomiting. Start on day 3 after chemotherapy. (Patient not taking: Reported on 09/16/2017) 30 tablet 1  . oxyCODONE (OXY IR/ROXICODONE) 5 MG immediate release tablet Take 1-2 tablets (5-10 mg total) by mouth every 6 (six) hours as needed for moderate pain, severe pain or breakthrough pain. (Patient not taking: Reported on 09/16/2017) 10 tablet 0  . prochlorperazine (COMPAZINE) 10 MG tablet Take 1 tablet (10 mg total) by mouth every 6 (six) hours as needed (Nausea or vomiting). (Patient not taking: Reported on 09/16/2017) 30 tablet 1  . Pseudoeph-Doxylamine-DM-APAP (DAYQUIL/NYQUIL COLD/FLU RELIEF PO) Take 1 Dose by mouth daily as needed (flu symptoms).    . traMADol (ULTRAM) 50 MG tablet Take 1 tablet (50 mg total) by mouth every 6 (six) hours as needed for moderate pain. (Patient not taking: Reported on  09/05/2017) 30 tablet 0   No current facility-administered medications for this visit.     PHYSICAL EXAMINATION: ECOG PERFORMANCE STATUS: 1 - Symptomatic but completely ambulatory  Vitals:   10/21/17 1140  BP: (!) 152/99  Pulse:  (!) 57  Resp: 20  Temp: (!) 97.5 F (36.4 C)  SpO2: 100%   Filed Weights   10/21/17 1140  Weight: 177 lb 4.8 oz (80.4 kg)    GENERAL:alert, no distress and comfortable SKIN: skin color, texture, turgor are normal, no rashes or significant lesions EYES: normal, Conjunctiva are pink and non-injected, sclera clear OROPHARYNX:no exudate, no erythema and lips, buccal mucosa, and tongue normal  NECK: supple, thyroid normal size, non-tender, without nodularity LYMPH:  no palpable lymphadenopathy in the cervical, axillary or inguinal LUNGS: clear to auscultation and percussion with normal breathing effort HEART: regular rate & rhythm and no murmurs and no lower extremity edema ABDOMEN:abdomen soft, non-tender and normal bowel sounds MUSCULOSKELETAL:no cyanosis of digits and no clubbing  NEURO: alert & oriented x 3 with fluent speech, no focal motor/sensory deficits EXTREMITIES: No lower extremity edema   LABORATORY DATA:  I have reviewed the data as listed   Chemistry      Component Value Date/Time   NA 140 10/21/2017 1113   K 3.8 10/21/2017 1113   CL 105 09/08/2017 1250   CO2 23 10/21/2017 1113   BUN 4.8 (L) 10/21/2017 1113   CREATININE 0.8 10/21/2017 1113      Component Value Date/Time   CALCIUM 9.0 10/21/2017 1113   ALKPHOS 82 10/21/2017 1113   AST 28 10/21/2017 1113   ALT 41 10/21/2017 1113   BILITOT 0.33 10/21/2017 1113       Lab Results  Component Value Date   WBC 5.4 10/21/2017   HGB 11.5 (L) 10/21/2017   HCT 35.1 10/21/2017   MCV 84.4 10/21/2017   PLT 194 10/21/2017   NEUTROABS 2.7 10/21/2017    ASSESSMENT & PLAN:  Malignant neoplasm of lower-outer quadrant of left breast of female, estrogen receptor negative ([place]) 08/29/2017: Left breast 5:00 position: 2.7 cm mass, left axillary LN 3.5 cm, adjacent lymph node 0.9 cm, third lymph node 1.1 cm; Biopsy IDC grade 3 with DCIS, lymph node biopsy positive for metastatic ductal carcinoma, ER 0%, PR 0%,HER-2 negative  ratio 1.28.  Treatment plan: 1. Neoadjuvant chemotherapy with Gemcitabine and Carboplatin started 09/09/2017 (chosen due to prior Adriamycin treatment and Taxol related severe neuropathy) 2. Followed by breast conserving surgery with targeted no dissection if possible 3. Followed by radiation  No evidence of distant metastasis on bone scan and CT chest, abdomen, and pelvis from 09/13/2017.  ------------------------------------------------------------------------------------------------------------------------ Current treatment: Cycle 3-day 1 carboplatin and gemcitabine Chemo toxicities: 1.  Fatigue 2. mild nausea  Patient is extremely anxious that the breast cancer may not be responding.  Because of this I would like to obtain a mammogram and ultrasound.  If it shows responsibly we will continue and finish 6 cycles of chemo.  If it does not show response then we may have to proceed with breast surgery.  Severe stress and anxiety: I will request guided imagery for her. Return to clinic in 1 week     I spent 25 minutes talking to the patient of which more than half was spent in counseling and coordination of care.  Orders Placed This Encounter  Procedures  . US BREAST LTD UNI LEFT INC AXILLA    Standing Status:   Future    Standing Expiration Date:  12/21/2018    Order Specific Question:   Reason for Exam (SYMPTOM  OR DIAGNOSIS REQUIRED)    Answer:   Mid point on neoadj chemo    Order Specific Question:   Preferred imaging location?    Answer:   [facility]  . MM DIAG BREAST TOMO UNI LEFT    Standing Status:   Future    Standing Expiration Date:   10/21/2018    Order Specific Question:   Reason for Exam (SYMPTOM  OR DIAGNOSIS REQUIRED)    Answer:   Mid point on neoadj chemo    Order Specific Question:   Is the patient pregnant?    Answer:   No    Order Specific Question:   Preferred imaging location?    Answer:   [facility]   The patient has a good understanding  of the overall plan. she agrees with it. she will call with any problems that may develop before the next visit here.   Rulon Eisenmenger, MD 10/21/17

## 2017-10-21 NOTE — Assessment & Plan Note (Addendum)
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  Treatment plan: 1. Neoadjuvant chemotherapy with Gemcitabine and Carboplatin started 09/09/2017 (chosen due to prior Adriamycin treatment and Taxol related severe neuropathy) 2. Followed by breast conserving surgery with targeted no dissection if possible 3. Followed by radiation  No evidence of distant metastasis on bone scan and CT chest, abdomen, and pelvis from 09/13/2017.  ------------------------------------------------------------------------------------------------------------------------ Current treatment: Cycle 3-day 1 carboplatin and gemcitabine Chemo toxicities:  Return to clinic in 3 weeks for cycle 4

## 2017-10-21 NOTE — Patient Instructions (Signed)
[facility] Discharge Instructions for Patients Receiving Chemotherapy  Today you received the following chemotherapy agents Carboplatin and Gemzar  To help prevent nausea and vomiting after your treatment, we encourage you to take your nausea medication as directed.  If you develop nausea and vomiting that is not controlled by your nausea medication, call the clinic.   BELOW ARE SYMPTOMS THAT SHOULD BE REPORTED IMMEDIATELY:  *FEVER GREATER THAN 100.5 F  *CHILLS WITH OR WITHOUT FEVER  NAUSEA AND VOMITING THAT IS NOT CONTROLLED WITH YOUR NAUSEA MEDICATION  *UNUSUAL SHORTNESS OF BREATH  *UNUSUAL BRUISING OR BLEEDING  TENDERNESS IN MOUTH AND THROAT WITH OR WITHOUT PRESENCE OF ULCERS  *URINARY PROBLEMS  *BOWEL PROBLEMS  UNUSUAL RASH Items with * indicate a potential emergency and should be followed up as soon as possible.  Feel free to call the clinic should you have any questions or concerns. The clinic phone number is [phone].  Please show the CHEMO ALERT CARD at check-in to the Emergency Department and triage nurse.   

## 2017-10-22 ENCOUNTER — Telehealth: Payer: Self-pay

## 2017-10-22 NOTE — Telephone Encounter (Signed)
Spoke with patient concerning upcoming appointment.per 11/30 los to add MD> visit on 12/7 to already schedule appointments by [facility] ([place]). Per 11/30 los

## 2017-10-28 ENCOUNTER — Ambulatory Visit (HOSPITAL_BASED_OUTPATIENT_CLINIC_OR_DEPARTMENT_OTHER): Payer: Self-pay | Admitting: Hematology and Oncology

## 2017-10-28 ENCOUNTER — Telehealth: Payer: Self-pay | Admitting: Hematology and Oncology

## 2017-10-28 ENCOUNTER — Ambulatory Visit: Payer: Self-pay

## 2017-10-28 ENCOUNTER — Ambulatory Visit (HOSPITAL_BASED_OUTPATIENT_CLINIC_OR_DEPARTMENT_OTHER): Payer: Self-pay

## 2017-10-28 ENCOUNTER — Other Ambulatory Visit (HOSPITAL_BASED_OUTPATIENT_CLINIC_OR_DEPARTMENT_OTHER): Payer: Self-pay

## 2017-10-28 ENCOUNTER — Other Ambulatory Visit: Payer: No Typology Code available for payment source

## 2017-10-28 DIAGNOSIS — Z171 Estrogen receptor negative status [ER-]: Secondary | ICD-10-CM

## 2017-10-28 DIAGNOSIS — C50512 Malignant neoplasm of lower-outer quadrant of left female breast: Secondary | ICD-10-CM

## 2017-10-28 DIAGNOSIS — C773 Secondary and unspecified malignant neoplasm of axilla and upper limb lymph nodes: Secondary | ICD-10-CM

## 2017-10-28 DIAGNOSIS — Z95828 Presence of other vascular implants and grafts: Secondary | ICD-10-CM

## 2017-10-28 DIAGNOSIS — Z5111 Encounter for antineoplastic chemotherapy: Secondary | ICD-10-CM

## 2017-10-28 DIAGNOSIS — R5383 Other fatigue: Secondary | ICD-10-CM

## 2017-10-28 LAB — COMPREHENSIVE METABOLIC PANEL
ALBUMIN: 3.8 g/dL (ref 3.5–5.0)
ALT: 45 U/L (ref 0–55)
ANION GAP: 9 meq/L (ref 3–11)
AST: 30 U/L (ref 5–34)
Alkaline Phosphatase: 80 U/L (ref 40–150)
BILIRUBIN TOTAL: 0.3 mg/dL (ref 0.20–1.20)
BUN: 5.8 mg/dL — ABNORMAL LOW (ref 7.0–26.0)
CALCIUM: 8.8 mg/dL (ref 8.4–10.4)
CO2: 23 mEq/L (ref 22–29)
CREATININE: 0.8 mg/dL (ref 0.6–1.1)
Chloride: 105 mEq/L (ref 98–109)
EGFR: >60 mL/min/{1.73_m2} (ref >60)
Glucose: 102 mg/dl (ref 70–140)
Potassium: 3.5 mEq/L (ref 3.5–5.1)
Sodium: 138 mEq/L (ref 136–145)
TOTAL PROTEIN: 6.9 g/dL (ref 6.4–8.3)

## 2017-10-28 LAB — CBC WITH DIFFERENTIAL/PLATELET
BASO%: 0.7 % (ref 0.0–2.0)
Basophils Absolute: 0 10*3/uL (ref 0.0–0.1)
EOS%: 1.1 % (ref 0.0–7.0)
Eosinophils Absolute: 0.1 10*3/uL (ref 0.0–0.5)
HEMATOCRIT: 31.1 % — AB (ref 34.8–46.6)
HGB: 10.1 g/dL — ABNORMAL LOW (ref 11.6–15.9)
LYMPH#: 2.4 10*3/uL (ref 0.9–3.3)
LYMPH%: 53.2 % — ABNORMAL HIGH (ref 14.0–49.7)
MCH: 27.8 pg (ref 25.1–34.0)
MCHC: 32.5 g/dL (ref 31.5–36.0)
MCV: 85.7 fL (ref 79.5–101.0)
MONO#: 0.2 10*3/uL (ref 0.1–0.9)
MONO%: 3.7 % (ref 0.0–14.0)
NEUT%: 41.3 % (ref 38.4–76.8)
NEUTROS ABS: 1.9 10*3/uL (ref 1.5–6.5)
PLATELETS: 196 10*3/uL (ref 145–400)
RBC: 3.63 10*6/uL — ABNORMAL LOW (ref 3.70–5.45)
RDW: 16.3 % — ABNORMAL HIGH (ref 11.2–14.5)
WBC: 4.6 10*3/uL (ref 3.9–10.3)

## 2017-10-28 MED ORDER — HEPARIN SOD (PORK) LOCK FLUSH 100 UNIT/ML IV SOLN
500.0000 [IU] | Freq: Once | INTRAVENOUS | Status: AC | PRN
  Administered 2017-10-28: 500 [IU]
  Filled 2017-10-28: qty 5

## 2017-10-28 MED ORDER — SODIUM CHLORIDE 0.9% FLUSH
10.0000 mL | INTRAVENOUS | Status: DC | PRN
  Administered 2017-10-28: 10 mL
  Filled 2017-10-28: qty 10

## 2017-10-28 MED ORDER — SODIUM CHLORIDE 0.9 % IV SOLN
1000.0000 mg/m2 | Freq: Once | INTRAVENOUS | Status: AC
  Administered 2017-10-28: 1976 mg via INTRAVENOUS
  Filled 2017-10-28: qty 51.97

## 2017-10-28 MED ORDER — PALONOSETRON HCL INJECTION 0.25 MG/5ML
0.2500 mg | Freq: Once | INTRAVENOUS | Status: AC
  Administered 2017-10-28: 0.25 mg via INTRAVENOUS

## 2017-10-28 MED ORDER — DEXAMETHASONE SODIUM PHOSPHATE 10 MG/ML IJ SOLN
10.0000 mg | Freq: Once | INTRAMUSCULAR | Status: AC
  Administered 2017-10-28: 10 mg via INTRAVENOUS

## 2017-10-28 MED ORDER — DEXAMETHASONE SODIUM PHOSPHATE 10 MG/ML IJ SOLN
INTRAMUSCULAR | Status: AC
  Filled 2017-10-28: qty 1

## 2017-10-28 MED ORDER — SODIUM CHLORIDE 0.9% FLUSH
10.0000 mL | Freq: Once | INTRAVENOUS | Status: AC
  Administered 2017-10-28: 10 mL
  Filled 2017-10-28: qty 10

## 2017-10-28 MED ORDER — SODIUM CHLORIDE 0.9 % IV SOLN
Freq: Once | INTRAVENOUS | Status: AC
  Administered 2017-10-28: 14:00:00 via INTRAVENOUS

## 2017-10-28 MED ORDER — PALONOSETRON HCL INJECTION 0.25 MG/5ML
INTRAVENOUS | Status: AC
  Filled 2017-10-28: qty 5

## 2017-10-28 MED ORDER — SODIUM CHLORIDE 0.9 % IV SOLN
256.4000 mg | Freq: Once | INTRAVENOUS | Status: AC
  Administered 2017-10-28: 260 mg via INTRAVENOUS
  Filled 2017-10-28: qty 26

## 2017-10-28 NOTE — Assessment & Plan Note (Signed)
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  Treatment plan: 1. Neoadjuvant chemotherapy with Gemcitabine and Carboplatin started 09/09/2017 (chosen due to prior Adriamycin treatment and Taxol related severe neuropathy) 2. Followed by breast conserving surgery with targeted no dissection if possible 3. Followed by radiation  No evidence of distant metastasis on bone scan and CT chest, abdomen, and pelvis from 09/13/2017.  ------------------------------------------------------------------------------------------------------------------------ Current treatment: Cycle 4-day 1 carboplatin and gemcitabine  Chemo toxicities: 1.  Fatigue 2. mild nausea  Patient is extremely anxious that the breast cancer may not be responding.  Because of this I would like to obtain a mammogram and ultrasound.  If it shows responsibly we will continue and finish 6 cycles of chemo.  If it does not show response then we may have to proceed with breast surgery.   Return to clinic in 3 weeks for cycle 5

## 2017-10-28 NOTE — Telephone Encounter (Signed)
Gave patient avs and calendar with appts per 12/7 los.

## 2017-10-28 NOTE — Patient Instructions (Signed)
[place] Discharge Instructions for Patients Receiving Chemotherapy  Today you received the following chemotherapy agents Carboplatin, Gemzar  To help prevent nausea and vomiting after your treatment, we encourage you to take your nausea medication as needed   If you develop nausea and vomiting that is not controlled by your nausea medication, call the clinic.   BELOW ARE SYMPTOMS THAT SHOULD BE REPORTED IMMEDIATELY:  *FEVER GREATER THAN 100.5 F  *CHILLS WITH OR WITHOUT FEVER  NAUSEA AND VOMITING THAT IS NOT CONTROLLED WITH YOUR NAUSEA MEDICATION  *UNUSUAL SHORTNESS OF BREATH  *UNUSUAL BRUISING OR BLEEDING  TENDERNESS IN MOUTH AND THROAT WITH OR WITHOUT PRESENCE OF ULCERS  *URINARY PROBLEMS  *BOWEL PROBLEMS  UNUSUAL RASH Items with * indicate a potential emergency and should be followed up as soon as possible.  Feel free to call the clinic should you have any questions or concerns. The clinic phone number is [phone].  Please show the [place] at check-in to the Emergency Department and triage nurse.

## 2017-10-28 NOTE — Progress Notes (Signed)
Patient Care Team: Patient, No Pcp Per as PCP - General (General Practice) Stark Klein, MD as Consulting Physician (General Surgery)  DIAGNOSIS:  Encounter Diagnosis  Name Primary?  . Malignant neoplasm of lower-outer quadrant of left breast of female, estrogen receptor negative ([place])     SUMMARY OF ONCOLOGIC HISTORY:   Malignant neoplasm of lower-outer quadrant of left breast of female, estrogen receptor negative ([place])   2003 Initial Biopsy    Right breast cancer diagnosed and treated at [facility] hormone receptor positive, 30 positive axillary lymph nodes modified radical mastectomy followed by chemotherapy, did not take radiation on antiestrogen therapy      08/29/2017 Initial Diagnosis    Left breast 5:00 position: 2.7 cm mass, left axillary LN 3.5 cm, adjacent lymph node 0.9 cm, third lymph node 1.1 cm; Biopsy IDC grade 3 with DCIS, lymph node biopsy positive for metastatic ductal carcinoma, ER 0%, PR 0%,HER-2 negative ratio 1.28, stage IIIB AJCC 8      09/09/2017 -  Neo-Adjuvant Chemotherapy    Gemcitabine/Carboplatin given on day 1 and day 8 of a 21 day cycle       CHIEF COMPLIANT: Cycle 3 day 8 neoadjuvant chemotherapy with carboplatin and gemcitabine  INTERVAL HISTORY: Suzanne May is a 56 year old with above-mentioned history of left breast cancer who is currently on neoadjuvant chemotherapy with carboplatin and gemcitabine because of prior Adriamycin and Taxol treatments.  She is tolerating the chemo fairly well except for fatigue and mild nausea.  Her major complaint today is that she is losing some hair.  She is also felt abdominal cramps and back pain for which she took Aleve  REVIEW OF SYSTEMS:   Constitutional: Denies fevers, chills or abnormal weight loss Eyes: Denies blurriness of vision Ears, nose, mouth, throat, and face: Denies mucositis or sore throat Respiratory: Denies cough, dyspnea or wheezes Cardiovascular: Denies palpitation, chest  discomfort Gastrointestinal: Mild nausea, abdominal cramps Skin: Denies abnormal skin rashes Lymphatics: Denies new lymphadenopathy or easy bruising Neurological:Denies numbness, tingling or new weaknesses Behavioral/Psych: Mood is stable, no new changes  Extremities: No lower extremity edema  All other systems were reviewed with the patient and are negative.  I have reviewed the past medical history, past surgical history, social history and family history with the patient and they are unchanged from previous note.  ALLERGIES:  is allergic to olive oil and penicillins.  MEDICATIONS:  Current Outpatient Medications  Medication Sig Dispense Refill  . lidocaine-prilocaine (EMLA) cream Apply to affected area once 30 g 3  . naproxen sodium (ANAPROX) 220 MG tablet Take 220-440 mg by mouth daily as needed (headache).    . ondansetron (ZOFRAN) 8 MG tablet Take 1 tablet (8 mg total) by mouth 2 (two) times daily as needed for refractory nausea / vomiting. Start on day 3 after chemotherapy. (Patient not taking: Reported on 09/16/2017) 30 tablet 1  . oxyCODONE (OXY IR/ROXICODONE) 5 MG immediate release tablet Take 1-2 tablets (5-10 mg total) by mouth every 6 (six) hours as needed for moderate pain, severe pain or breakthrough pain. (Patient not taking: Reported on 09/16/2017) 10 tablet 0  . prochlorperazine (COMPAZINE) 10 MG tablet Take 1 tablet (10 mg total) by mouth every 6 (six) hours as needed (Nausea or vomiting). (Patient not taking: Reported on 09/16/2017) 30 tablet 1  . Pseudoeph-Doxylamine-DM-APAP (DAYQUIL/NYQUIL COLD/FLU RELIEF PO) Take 1 Dose by mouth daily as needed (flu symptoms).    . traMADol (ULTRAM) 50 MG tablet Take 1 tablet (50 mg total) by mouth  every 6 (six) hours as needed for moderate pain. (Patient not taking: Reported on 09/05/2017) 30 tablet 0   No current facility-administered medications for this visit.     PHYSICAL EXAMINATION: ECOG PERFORMANCE STATUS: 1 - Symptomatic  but completely ambulatory  Vitals:   10/28/17 1217  BP: 107/89  Pulse: 77  Resp: 18  Temp: 98.1 F (36.7 C)  SpO2: 100%   Filed Weights   10/28/17 1217  Weight: 176 lb 4.8 oz (80 kg)    GENERAL:alert, no distress and comfortable SKIN: skin color, texture, turgor are normal, no rashes or significant lesions EYES: normal, Conjunctiva are pink and non-injected, sclera clear OROPHARYNX:no exudate, no erythema and lips, buccal mucosa, and tongue normal  NECK: supple, thyroid normal size, non-tender, without nodularity LYMPH:  no palpable lymphadenopathy in the cervical, axillary or inguinal LUNGS: clear to auscultation and percussion with normal breathing effort HEART: regular rate & rhythm and no murmurs and no lower extremity edema ABDOMEN:abdomen soft, non-tender and normal bowel sounds MUSCULOSKELETAL:no cyanosis of digits and no clubbing  NEURO: alert & oriented x 3 with fluent speech, no focal motor/sensory deficits EXTREMITIES: No lower extremity edema  LABORATORY DATA:  I have reviewed the data as listed   Chemistry      Component Value Date/Time   NA 138 10/28/2017 1146   K 3.5 10/28/2017 1146   CL 105 09/08/2017 1250   CO2 23 10/28/2017 1146   BUN 5.8 (L) 10/28/2017 1146   CREATININE 0.8 10/28/2017 1146      Component Value Date/Time   CALCIUM 8.8 10/28/2017 1146   ALKPHOS 80 10/28/2017 1146   AST 30 10/28/2017 1146   ALT 45 10/28/2017 1146   BILITOT 0.30 10/28/2017 1146       Lab Results  Component Value Date   WBC 4.6 10/28/2017   HGB 10.1 (L) 10/28/2017   HCT 31.1 (L) 10/28/2017   MCV 85.7 10/28/2017   PLT 196 10/28/2017   NEUTROABS 1.9 10/28/2017    ASSESSMENT & PLAN:  Malignant neoplasm of lower-outer quadrant of left breast of female, estrogen receptor negative ([place]) 08/29/2017: Left breast 5:00 position: 2.7 cm mass, left axillary LN 3.5 cm, adjacent lymph node 0.9 cm, third lymph node 1.1 cm; Biopsy IDC grade 3 with DCIS, lymph node biopsy  positive for metastatic ductal carcinoma, ER 0%, PR 0%,HER-2 negative ratio 1.28.  Treatment plan: 1. Neoadjuvant chemotherapy with Gemcitabine and Carboplatin started 09/09/2017 (chosen due to prior Adriamycin treatment and Taxol related severe neuropathy) 2. Followed by breast conserving surgery with targeted no dissection if possible 3. Followed by radiation  No evidence of distant metastasis on bone scan and CT chest, abdomen, and pelvis from 09/13/2017.  ------------------------------------------------------------------------------------------------------------------------ Current treatment: Cycle 3 day 8 carboplatin and gemcitabine  Chemo toxicities: 1.  Fatigue 2. mild nausea 3.  Abdominal cramps: I discussed with her about taking Bentyl.  She does not want to take any more medications.   Patient is extremely anxious that the breast cancer may not be responding.  Because of this I would like to obtain a mammogram and ultrasound.  If it shows responsibly we will continue and finish 6 cycles of chemo.  If it does not show response then we may have to proceed with breast surgery. She may have a mammogram and ultrasound next week.  I will call her with the results of the scans.  If the scans show response to treatment then we would like to proceed with her current treatment plan and  finish up her entire 6 cycles.  However if she does not have a good response then we may have to proceed with surgery  Return to clinic for her chemotherapy treatments.   I spent 25 minutes talking to the patient of which more than half was spent in counseling and coordination of care.  No orders of the defined types were placed in this encounter.  The patient has a good understanding of the overall plan. she agrees with it. she will call with any problems that may develop before the next visit here.   Rulon Eisenmenger, MD 10/28/17

## 2017-11-04 ENCOUNTER — Ambulatory Visit
Admission: RE | Admit: 2017-11-04 | Discharge: 2017-11-04 | Disposition: A | Discharge status: Home / Self Care | Payer: No Typology Code available for payment source | Source: Ambulatory Visit | Attending: Hematology and Oncology | Admitting: Hematology and Oncology

## 2017-11-04 ENCOUNTER — Other Ambulatory Visit: Payer: Self-pay | Admitting: Hematology and Oncology

## 2017-11-04 DIAGNOSIS — C50512 Malignant neoplasm of lower-outer quadrant of left female breast: Secondary | ICD-10-CM

## 2017-11-04 DIAGNOSIS — Z171 Estrogen receptor negative status [ER-]: Principal | ICD-10-CM

## 2017-11-11 ENCOUNTER — Ambulatory Visit: Payer: Self-pay

## 2017-11-11 ENCOUNTER — Encounter: Payer: Self-pay | Admitting: Adult Health

## 2017-11-11 ENCOUNTER — Ambulatory Visit (HOSPITAL_BASED_OUTPATIENT_CLINIC_OR_DEPARTMENT_OTHER): Payer: Self-pay

## 2017-11-11 ENCOUNTER — Ambulatory Visit (HOSPITAL_BASED_OUTPATIENT_CLINIC_OR_DEPARTMENT_OTHER): Payer: Self-pay | Admitting: Adult Health

## 2017-11-11 ENCOUNTER — Other Ambulatory Visit (HOSPITAL_BASED_OUTPATIENT_CLINIC_OR_DEPARTMENT_OTHER): Payer: Self-pay

## 2017-11-11 VITALS — BP 136/84 | HR 82 | Temp 97.9°F | Resp 18 | Ht 67.0 in | Wt 167.3 lb

## 2017-11-11 DIAGNOSIS — Z171 Estrogen receptor negative status [ER-]: Principal | ICD-10-CM

## 2017-11-11 DIAGNOSIS — Z5111 Encounter for antineoplastic chemotherapy: Secondary | ICD-10-CM

## 2017-11-11 DIAGNOSIS — C50512 Malignant neoplasm of lower-outer quadrant of left female breast: Secondary | ICD-10-CM

## 2017-11-11 DIAGNOSIS — Z95828 Presence of other vascular implants and grafts: Secondary | ICD-10-CM

## 2017-11-11 DIAGNOSIS — R110 Nausea: Secondary | ICD-10-CM

## 2017-11-11 DIAGNOSIS — R5383 Other fatigue: Secondary | ICD-10-CM

## 2017-11-11 LAB — CBC WITH DIFFERENTIAL/PLATELET
BASO%: 0.2 % (ref 0.0–2.0)
BASOS ABS: 0 10*3/uL (ref 0.0–0.1)
EOS ABS: 0.1 10*3/uL (ref 0.0–0.5)
EOS%: 1.4 % (ref 0.0–7.0)
HCT: 30.7 % — ABNORMAL LOW (ref 34.8–46.6)
HGB: 10.1 g/dL — ABNORMAL LOW (ref 11.6–15.9)
LYMPH%: 37.3 % (ref 14.0–49.7)
MCH: 28.5 pg (ref 25.1–34.0)
MCHC: 32.9 g/dL (ref 31.5–36.0)
MCV: 86.7 fL (ref 79.5–101.0)
MONO#: 0.3 10*3/uL (ref 0.1–0.9)
MONO%: 7.5 % (ref 0.0–14.0)
NEUT%: 53.6 % (ref 38.4–76.8)
NEUTROS ABS: 2.4 10*3/uL (ref 1.5–6.5)
PLATELETS: 207 10*3/uL (ref 145–400)
RBC: 3.54 10*6/uL — AB (ref 3.70–5.45)
RDW: 19.2 % — ABNORMAL HIGH (ref 11.2–14.5)
WBC: 4.4 10*3/uL (ref 3.9–10.3)
lymph#: 1.6 10*3/uL (ref 0.9–3.3)
nRBC: 0 % (ref 0–0)

## 2017-11-11 LAB — COMPREHENSIVE METABOLIC PANEL
ALT: 26 U/L (ref 0–55)
AST: 25 U/L (ref 5–34)
Albumin: 3.9 g/dL (ref 3.5–5.0)
Alkaline Phosphatase: 79 U/L (ref 40–150)
Anion Gap: 11 mEq/L (ref 3–11)
BUN: 6.3 mg/dL — AB (ref 7.0–26.0)
CALCIUM: 8.8 mg/dL (ref 8.4–10.4)
CHLORIDE: 106 meq/L (ref 98–109)
CO2: 22 mEq/L (ref 22–29)
CREATININE: 0.8 mg/dL (ref 0.6–1.1)
EGFR: >60 mL/min/{1.73_m2} (ref >60)
Glucose: 123 mg/dl (ref 70–140)
Potassium: 3.6 mEq/L (ref 3.5–5.1)
Sodium: 140 mEq/L (ref 136–145)
TOTAL PROTEIN: 6.8 g/dL (ref 6.4–8.3)
Total Bilirubin: 0.43 mg/dL (ref 0.20–1.20)

## 2017-11-11 MED ORDER — DEXAMETHASONE SODIUM PHOSPHATE 10 MG/ML IJ SOLN
10.0000 mg | Freq: Once | INTRAMUSCULAR | Status: AC
  Administered 2017-11-11: 10 mg via INTRAVENOUS

## 2017-11-11 MED ORDER — SODIUM CHLORIDE 0.9 % IV SOLN
Freq: Once | INTRAVENOUS | Status: AC
  Administered 2017-11-11: 13:00:00 via INTRAVENOUS

## 2017-11-11 MED ORDER — SODIUM CHLORIDE 0.9% FLUSH
10.0000 mL | INTRAVENOUS | Status: DC | PRN
Start: 2017-11-11 — End: 2017-11-11
  Administered 2017-11-11: 10 mL
  Filled 2017-11-11: qty 10

## 2017-11-11 MED ORDER — PALONOSETRON HCL INJECTION 0.25 MG/5ML
0.2500 mg | Freq: Once | INTRAVENOUS | Status: AC
  Administered 2017-11-11: 0.25 mg via INTRAVENOUS

## 2017-11-11 MED ORDER — HEPARIN SOD (PORK) LOCK FLUSH 100 UNIT/ML IV SOLN
500.0000 [IU] | Freq: Once | INTRAVENOUS | Status: AC | PRN
  Administered 2017-11-11: 500 [IU]
  Filled 2017-11-11: qty 5

## 2017-11-11 MED ORDER — SODIUM CHLORIDE 0.9% FLUSH
10.0000 mL | Freq: Once | INTRAVENOUS | Status: AC
  Administered 2017-11-11: 10 mL
  Filled 2017-11-11: qty 10

## 2017-11-11 MED ORDER — SODIUM CHLORIDE 0.9 % IV SOLN
260.0000 mg | Freq: Once | INTRAVENOUS | Status: AC
  Administered 2017-11-11: 260 mg via INTRAVENOUS
  Filled 2017-11-11: qty 26

## 2017-11-11 MED ORDER — SODIUM CHLORIDE 0.9 % IV SOLN
1000.0000 mg/m2 | Freq: Once | INTRAVENOUS | Status: AC
  Administered 2017-11-11: 1976 mg via INTRAVENOUS
  Filled 2017-11-11: qty 51.97

## 2017-11-11 MED ORDER — DEXAMETHASONE SODIUM PHOSPHATE 10 MG/ML IJ SOLN
INTRAMUSCULAR | Status: AC
  Filled 2017-11-11: qty 1

## 2017-11-11 MED ORDER — PALONOSETRON HCL INJECTION 0.25 MG/5ML
INTRAVENOUS | Status: AC
  Filled 2017-11-11: qty 5
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
## 2017-11-11 NOTE — Assessment & Plan Note (Addendum)
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  Treatment plan: 1. Neoadjuvant chemotherapy with Gemcitabine and Carboplatin started 09/09/2017 (chosen due to prior Adriamycin treatment and Taxol related severe neuropathy) 2. Followed by breast conserving surgery with targeted no dissection if possible 3. Followed by radiation  No evidence of distant metastasis on bone scan and CT chest, abdomen, and pelvis from 09/13/2017.  ------------------------------------------------------------------------------------------------------------------------ Current treatment: Cycle 5-day 1 carboplatin and gemcitabine  Chemo toxicities: 1.  Fatigue 2. mild nausea  Suzanne May is doing well today.  I reviewed her results with her in detail and the fact that the cancer is shrinking.  She was hoping that the cancer would be smaller than it actually is.  I recommended that we proceed with treatment today and I will request Dr. Lindi Adie call her when he returns after Christmas.  She verbalizes understanding of this.  She will return in 1 week for cycle 5 day 8 and in three weeks for labs, f/u with Dr. Lindi Adie, and cycle 6 of treatment.

## 2017-11-11 NOTE — Patient Instructions (Signed)
[facility] Discharge Instructions for Patients Receiving Chemotherapy  Today you received the following chemotherapy agents: Carboplatin, Gemzar  To help prevent nausea and vomiting after your treatment, we encourage you to take your nausea medication as needed   If you develop nausea and vomiting that is not controlled by your nausea medication, call the clinic.   BELOW ARE SYMPTOMS THAT SHOULD BE REPORTED IMMEDIATELY:  *FEVER GREATER THAN 100.5 F  *CHILLS WITH OR WITHOUT FEVER  NAUSEA AND VOMITING THAT IS NOT CONTROLLED WITH YOUR NAUSEA MEDICATION  *UNUSUAL SHORTNESS OF BREATH  *UNUSUAL BRUISING OR BLEEDING  TENDERNESS IN MOUTH AND THROAT WITH OR WITHOUT PRESENCE OF ULCERS  *URINARY PROBLEMS  *BOWEL PROBLEMS  UNUSUAL RASH Items with * indicate a potential emergency and should be followed up as soon as possible.  Feel free to call the clinic should you have any questions or concerns. The clinic phone number is [phone].  Please show the [place] at check-in to the Emergency Department and triage nurse.

## 2017-11-18 ENCOUNTER — Ambulatory Visit: Payer: Self-pay

## 2017-11-18 ENCOUNTER — Ambulatory Visit (HOSPITAL_BASED_OUTPATIENT_CLINIC_OR_DEPARTMENT_OTHER): Payer: Self-pay

## 2017-11-18 ENCOUNTER — Other Ambulatory Visit (HOSPITAL_BASED_OUTPATIENT_CLINIC_OR_DEPARTMENT_OTHER): Payer: Self-pay

## 2017-11-18 VITALS — BP 144/96 | HR 78 | Temp 98.3°F | Resp 16

## 2017-11-18 DIAGNOSIS — C50512 Malignant neoplasm of lower-outer quadrant of left female breast: Secondary | ICD-10-CM

## 2017-11-18 DIAGNOSIS — Z5111 Encounter for antineoplastic chemotherapy: Secondary | ICD-10-CM

## 2017-11-18 DIAGNOSIS — Z171 Estrogen receptor negative status [ER-]: Principal | ICD-10-CM

## 2017-11-18 DIAGNOSIS — Z95828 Presence of other vascular implants and grafts: Secondary | ICD-10-CM

## 2017-11-18 LAB — COMPREHENSIVE METABOLIC PANEL
ALBUMIN: 4.1 g/dL (ref 3.5–5.0)
ALK PHOS: 79 U/L (ref 40–150)
ALT: 44 U/L (ref 0–55)
ANION GAP: 10 meq/L (ref 3–11)
AST: 36 U/L — AB (ref 5–34)
BUN: 4.3 mg/dL — AB (ref 7.0–26.0)
CALCIUM: 9.1 mg/dL (ref 8.4–10.4)
CO2: 25 mEq/L (ref 22–29)
CREATININE: 0.8 mg/dL (ref 0.6–1.1)
Chloride: 104 mEq/L (ref 98–109)
EGFR: >60 mL/min/{1.73_m2} (ref >60)
Glucose: 84 mg/dl (ref 70–140)
Potassium: 3.7 mEq/L (ref 3.5–5.1)
Sodium: 139 mEq/L (ref 136–145)
Total Bilirubin: 0.47 mg/dL (ref 0.20–1.20)
Total Protein: 7.4 g/dL (ref 6.4–8.3)

## 2017-11-18 LAB — CBC WITH DIFFERENTIAL/PLATELET
BASO%: 0.7 % (ref 0.0–2.0)
Basophils Absolute: 0 10*3/uL (ref 0.0–0.1)
EOS ABS: 0 10*3/uL (ref 0.0–0.5)
EOS%: 0.7 % (ref 0.0–7.0)
HCT: 31.3 % — ABNORMAL LOW (ref 34.8–46.6)
HEMOGLOBIN: 10.2 g/dL — AB (ref 11.6–15.9)
LYMPH%: 52.2 % — AB (ref 14.0–49.7)
MCH: 28.6 pg (ref 25.1–34.0)
MCHC: 32.6 g/dL (ref 31.5–36.0)
MCV: 87.7 fL (ref 79.5–101.0)
MONO#: 0.2 10*3/uL (ref 0.1–0.9)
MONO%: 5.3 % (ref 0.0–14.0)
NEUT%: 41.1 % (ref 38.4–76.8)
NEUTROS ABS: 1.7 10*3/uL (ref 1.5–6.5)
PLATELETS: 241 10*3/uL (ref 145–400)
RBC: 3.57 10*6/uL — AB (ref 3.70–5.45)
RDW: 19.4 % — AB (ref 11.2–14.5)
WBC: 4.1 10*3/uL (ref 3.9–10.3)
lymph#: 2.2 10*3/uL (ref 0.9–3.3)

## 2017-11-18 LAB — TECHNOLOGIST REVIEW

## 2017-11-18 MED ORDER — PALONOSETRON HCL INJECTION 0.25 MG/5ML
0.2500 mg | Freq: Once | INTRAVENOUS | Status: AC
  Administered 2017-11-18: 0.25 mg via INTRAVENOUS

## 2017-11-18 MED ORDER — SODIUM CHLORIDE 0.9% FLUSH
10.0000 mL | Freq: Once | INTRAVENOUS | Status: AC
  Administered 2017-11-18: 10 mL
  Filled 2017-11-18: qty 10

## 2017-11-18 MED ORDER — DEXAMETHASONE SODIUM PHOSPHATE 10 MG/ML IJ SOLN
INTRAMUSCULAR | Status: AC
  Filled 2017-11-18: qty 1

## 2017-11-18 MED ORDER — SODIUM CHLORIDE 0.9 % IV SOLN
Freq: Once | INTRAVENOUS | Status: AC
  Administered 2017-11-18: 14:00:00 via INTRAVENOUS

## 2017-11-18 MED ORDER — SODIUM CHLORIDE 0.9 % IV SOLN
1000.0000 mg/m2 | Freq: Once | INTRAVENOUS | Status: AC
  Administered 2017-11-18: 1976 mg via INTRAVENOUS
  Filled 2017-11-18: qty 51.97

## 2017-11-18 MED ORDER — SODIUM CHLORIDE 0.9 % IV SOLN
256.4000 mg | Freq: Once | INTRAVENOUS | Status: AC
  Administered 2017-11-18: 260 mg via INTRAVENOUS
  Filled 2017-11-18: qty 26

## 2017-11-18 MED ORDER — PALONOSETRON HCL INJECTION 0.25 MG/5ML
INTRAVENOUS | Status: AC
  Filled 2017-11-18: qty 5

## 2017-11-18 MED ORDER — HEPARIN SOD (PORK) LOCK FLUSH 100 UNIT/ML IV SOLN
500.0000 [IU] | Freq: Once | INTRAVENOUS | Status: AC | PRN
  Administered 2017-11-18: 500 [IU]
  Filled 2017-11-18: qty 5

## 2017-11-18 MED ORDER — DEXAMETHASONE SODIUM PHOSPHATE 10 MG/ML IJ SOLN
10.0000 mg | Freq: Once | INTRAMUSCULAR | Status: AC
  Administered 2017-11-18: 10 mg via INTRAVENOUS

## 2017-11-18 MED ORDER — SODIUM CHLORIDE 0.9% FLUSH
10.0000 mL | INTRAVENOUS | Status: DC | PRN
  Administered 2017-11-18: 10 mL
  Filled 2017-11-18: qty 10

## 2017-11-18 NOTE — Patient Instructions (Signed)
[facility] Discharge Instructions for Patients Receiving Chemotherapy  Today you received the following chemotherapy agents Gemzar and Carboplatin   To help prevent nausea and vomiting after your treatment, we encourage you to take your nausea medication as directed.    If you develop nausea and vomiting that is not controlled by your nausea medication, call the clinic.   BELOW ARE SYMPTOMS THAT SHOULD BE REPORTED IMMEDIATELY:  *FEVER GREATER THAN 100.5 F  *CHILLS WITH OR WITHOUT FEVER  NAUSEA AND VOMITING THAT IS NOT CONTROLLED WITH YOUR NAUSEA MEDICATION  *UNUSUAL SHORTNESS OF BREATH  *UNUSUAL BRUISING OR BLEEDING  TENDERNESS IN MOUTH AND THROAT WITH OR WITHOUT PRESENCE OF ULCERS  *URINARY PROBLEMS  *BOWEL PROBLEMS  UNUSUAL RASH Items with * indicate a potential emergency and should be followed up as soon as possible.  Feel free to call the clinic should you have any questions or concerns. The clinic phone number is [phone].  Please show the CHEMO ALERT CARD at check-in to the Emergency Department and triage nurse.   

## 2017-12-01 NOTE — Assessment & Plan Note (Signed)
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  Treatment plan: 1. Neoadjuvant chemotherapy with Gemcitabine and Carboplatin started 09/09/2017 (chosen due to prior Adriamycin treatment and Taxol related severe neuropathy) 2. Followed by breast conserving surgery with targeted no dissection if possible 3. Followed by radiation  No evidence of distant metastasis on bone scan and CT chest, abdomen, and pelvis from 09/13/2017.  ------------------------------------------------------------------------------------------------------------------------ Current treatment: Cycle 5 day 1 carboplatin and gemcitabine  Chemo toxicities: 1.Fatigue 2.mild nausea 3.  Abdominal cramps: I discussed with her about taking Bentyl.  She does not want to take any more medications.  Mammogram and [place]: Response to neoadj chemo After 6 cycles, plan to do an MRI

## 2017-12-02 ENCOUNTER — Inpatient Hospital Stay: Payer: BC Managed Care – PPO

## 2017-12-02 ENCOUNTER — Inpatient Hospital Stay: Payer: BC Managed Care – PPO | Attending: Hematology and Oncology | Admitting: Hematology and Oncology

## 2017-12-02 DIAGNOSIS — R110 Nausea: Secondary | ICD-10-CM | POA: Insufficient documentation

## 2017-12-02 DIAGNOSIS — R197 Diarrhea, unspecified: Secondary | ICD-10-CM | POA: Insufficient documentation

## 2017-12-02 DIAGNOSIS — C773 Secondary and unspecified malignant neoplasm of axilla and upper limb lymph nodes: Secondary | ICD-10-CM | POA: Insufficient documentation

## 2017-12-02 DIAGNOSIS — Z95828 Presence of other vascular implants and grafts: Secondary | ICD-10-CM

## 2017-12-02 DIAGNOSIS — C50512 Malignant neoplasm of lower-outer quadrant of left female breast: Secondary | ICD-10-CM | POA: Diagnosis not present

## 2017-12-02 DIAGNOSIS — R5383 Other fatigue: Secondary | ICD-10-CM | POA: Insufficient documentation

## 2017-12-02 DIAGNOSIS — Z171 Estrogen receptor negative status [ER-]: Principal | ICD-10-CM

## 2017-12-02 DIAGNOSIS — Z5111 Encounter for antineoplastic chemotherapy: Secondary | ICD-10-CM | POA: Insufficient documentation

## 2017-12-02 LAB — COMPREHENSIVE METABOLIC PANEL
ALT: 35 U/L (ref 0–55)
AST: 35 U/L — AB (ref 5–34)
Albumin: 4.1 g/dL (ref 3.5–5.0)
Alkaline Phosphatase: 77 U/L (ref 40–150)
Anion gap: 9 (ref 3–11)
BILIRUBIN TOTAL: 0.6 mg/dL (ref 0.2–1.2)
BUN: 7 mg/dL (ref 7–26)
CO2: 25 mmol/L (ref 22–29)
CREATININE: 0.81 mg/dL (ref 0.60–1.10)
Calcium: 9.2 mg/dL (ref 8.4–10.4)
Chloride: 105 mmol/L (ref 98–109)
GFR calc Af Amer: >60 mL/min (ref >60)
Glucose, Bld: 87 mg/dL (ref 70–140)
Potassium: 3.7 mmol/L (ref 3.3–4.7)
Sodium: 139 mmol/L (ref 136–145)
TOTAL PROTEIN: 7.3 g/dL (ref 6.4–8.3)

## 2017-12-02 LAB — CBC WITH DIFFERENTIAL/PLATELET
BASOS ABS: 0 10*3/uL (ref 0.0–0.1)
Basophils Relative: 1 %
EOS ABS: 0.1 10*3/uL (ref 0.0–0.5)
EOS PCT: 1 %
HCT: 31.7 % — ABNORMAL LOW (ref 34.8–46.6)
Hemoglobin: 10.3 g/dL — ABNORMAL LOW (ref 11.6–15.9)
Lymphocytes Relative: 29 %
Lymphs Abs: 1.2 10*3/uL (ref 0.9–3.3)
MCH: 29.5 pg (ref 25.1–34.0)
MCHC: 32.6 g/dL (ref 31.5–36.0)
MCV: 90.6 fL (ref 79.5–101.0)
Monocytes Absolute: 0.6 10*3/uL (ref 0.1–0.9)
Monocytes Relative: 14 %
Neutro Abs: 2.3 10*3/uL (ref 1.5–6.5)
Neutrophils Relative %: 55 %
PLATELETS: 168 10*3/uL (ref 145–400)
RBC: 3.5 MIL/uL — AB (ref 3.70–5.45)
RDW: 23.8 % — ABNORMAL HIGH (ref 11.2–16.1)
WBC: 4.2 10*3/uL (ref 3.9–10.3)

## 2017-12-02 MED ORDER — HEPARIN SOD (PORK) LOCK FLUSH 100 UNIT/ML IV SOLN
500.0000 [IU] | Freq: Once | INTRAVENOUS | Status: AC | PRN
  Administered 2017-12-02: 500 [IU]
  Filled 2017-12-02: qty 5

## 2017-12-02 MED ORDER — DEXAMETHASONE SODIUM PHOSPHATE 10 MG/ML IJ SOLN
INTRAMUSCULAR | Status: AC
  Filled 2017-12-02: qty 1

## 2017-12-02 MED ORDER — SODIUM CHLORIDE 0.9 % IV SOLN
1000.0000 mg/m2 | Freq: Once | INTRAVENOUS | Status: AC
  Administered 2017-12-02: 1976 mg via INTRAVENOUS
  Filled 2017-12-02: qty 51.97

## 2017-12-02 MED ORDER — SODIUM CHLORIDE 0.9 % IV SOLN
250.0000 mg | Freq: Once | INTRAVENOUS | Status: AC
  Administered 2017-12-02: 250 mg via INTRAVENOUS
  Filled 2017-12-02: qty 25

## 2017-12-02 MED ORDER — DEXAMETHASONE SODIUM PHOSPHATE 10 MG/ML IJ SOLN
10.0000 mg | Freq: Once | INTRAMUSCULAR | Status: AC
  Administered 2017-12-02: 10 mg via INTRAVENOUS

## 2017-12-02 MED ORDER — SODIUM CHLORIDE 0.9% FLUSH
10.0000 mL | INTRAVENOUS | Status: DC | PRN
  Administered 2017-12-02: 10 mL
  Filled 2017-12-02: qty 10

## 2017-12-02 MED ORDER — SODIUM CHLORIDE 0.9 % IV SOLN
Freq: Once | INTRAVENOUS | Status: AC
  Administered 2017-12-02: 13:00:00 via INTRAVENOUS

## 2017-12-02 MED ORDER — PALONOSETRON HCL INJECTION 0.25 MG/5ML
INTRAVENOUS | Status: AC
  Filled 2017-12-02: qty 5

## 2017-12-02 MED ORDER — SODIUM CHLORIDE 0.9% FLUSH
10.0000 mL | Freq: Once | INTRAVENOUS | Status: AC
  Administered 2017-12-02: 10 mL
  Filled 2017-12-02: qty 10

## 2017-12-02 MED ORDER — PALONOSETRON HCL INJECTION 0.25 MG/5ML
0.2500 mg | Freq: Once | INTRAVENOUS | Status: AC
  Administered 2017-12-02: 0.25 mg via INTRAVENOUS

## 2017-12-02 NOTE — Progress Notes (Signed)
Patient Care Team: Patient, No Pcp Per as PCP - General (General Practice) Stark Klein, MD as Consulting Physician (General Surgery)  DIAGNOSIS:  Encounter Diagnosis  Name Primary?  . Malignant neoplasm of lower-outer quadrant of left breast of female, estrogen receptor negative ([place])     SUMMARY OF ONCOLOGIC HISTORY:   Malignant neoplasm of lower-outer quadrant of left breast of female, estrogen receptor negative ([place])   2003 Initial Biopsy    Right breast cancer diagnosed and treated at [facility] hormone receptor positive, 30 positive axillary lymph nodes modified radical mastectomy followed by chemotherapy, did not take radiation on antiestrogen therapy      08/29/2017 Initial Diagnosis    Left breast 5:00 position: 2.7 cm mass, left axillary LN 3.5 cm, adjacent lymph node 0.9 cm, third lymph node 1.1 cm; Biopsy IDC grade 3 with DCIS, lymph node biopsy positive for metastatic ductal carcinoma, ER 0%, PR 0%,HER-2 negative ratio 1.28, stage IIIB AJCC 8      09/09/2017 -  Neo-Adjuvant Chemotherapy    Gemcitabine/Carboplatin given on day 1 and day 8 of a 21 day cycle       CHIEF COMPLIANT: Cycle 5 carboplatin and gemcitabine  INTERVAL HISTORY: Suzanne May is a 57 year old with above-mentioned history of left breast cancer who is here to receive neoadjuvant chemotherapy with carboplatin and gemcitabine.  Today is cycle 5.  She is complaining of diarrhea with each chemotherapy however she does not want to take Imodium.  She has been taking marijuana to help her symptoms and she has done extremely well from the chemo standpoint without any nausea vomiting.  However she has not felt any major improvement in the left breast tumor.  REVIEW OF SYSTEMS:   Constitutional: Denies fevers, chills or abnormal weight loss Eyes: Denies blurriness of vision Ears, nose, mouth, throat, and face: Denies mucositis or sore throat Respiratory: Denies cough, dyspnea or wheezes Cardiovascular:  Denies palpitation, chest discomfort Gastrointestinal: Complaining of diarrhea Skin: Denies abnormal skin rashes Lymphatics: Denies new lymphadenopathy or easy bruising Neurological:Denies numbness, tingling or new weaknesses Behavioral/Psych: Mood is stable, no new changes  Extremities: No lower extremity edema Breast: Left breast tumor is not responding to chemo but clinically All other systems were reviewed with the patient and are negative.  I have reviewed the past medical history, past surgical history, social history and family history with the patient and they are unchanged from previous note.  ALLERGIES:  is allergic to olive oil and penicillins.  MEDICATIONS:  Current Outpatient Medications  Medication Sig Dispense Refill  . lidocaine-prilocaine (EMLA) cream Apply to affected area once 30 g 3  . naproxen sodium (ANAPROX) 220 MG tablet Take 220-440 mg by mouth daily as needed (headache).    . ondansetron (ZOFRAN) 8 MG tablet Take 1 tablet (8 mg total) by mouth 2 (two) times daily as needed for refractory nausea / vomiting. Start on day 3 after chemotherapy. (Patient not taking: Reported on 09/16/2017) 30 tablet 1  . oxyCODONE (OXY IR/ROXICODONE) 5 MG immediate release tablet Take 1-2 tablets (5-10 mg total) by mouth every 6 (six) hours as needed for moderate pain, severe pain or breakthrough pain. (Patient not taking: Reported on 09/16/2017) 10 tablet 0  . prochlorperazine (COMPAZINE) 10 MG tablet Take 1 tablet (10 mg total) by mouth every 6 (six) hours as needed (Nausea or vomiting). (Patient not taking: Reported on 09/16/2017) 30 tablet 1  . Pseudoeph-Doxylamine-DM-APAP (DAYQUIL/NYQUIL COLD/FLU RELIEF PO) Take 1 Dose by mouth daily as needed (flu symptoms).    Marland Kitchen  traMADol (ULTRAM) 50 MG tablet Take 1 tablet (50 mg total) by mouth every 6 (six) hours as needed for moderate pain. (Patient not taking: Reported on 09/05/2017) 30 tablet 0   No current facility-administered medications  for this visit.     PHYSICAL EXAMINATION: ECOG PERFORMANCE STATUS: 1 - Symptomatic but completely ambulatory  Vitals:   12/02/17 1142  BP: (!) 140/91  Pulse: 80  Resp: 20  Temp: 98 F (36.7 C)  SpO2: 99%   Filed Weights   12/02/17 1142  Weight: 173 lb 3.2 oz (78.6 kg)    GENERAL:alert, no distress and comfortable SKIN: skin color, texture, turgor are normal, no rashes or significant lesions EYES: normal, Conjunctiva are pink and non-injected, sclera clear OROPHARYNX:no exudate, no erythema and lips, buccal mucosa, and tongue normal  NECK: supple, thyroid normal size, non-tender, without nodularity LYMPH:  no palpable lymphadenopathy in the cervical, axillary or inguinal LUNGS: clear to auscultation and percussion with normal breathing effort HEART: regular rate & rhythm and no murmurs and no lower extremity edema ABDOMEN:abdomen soft, non-tender and normal bowel sounds MUSCULOSKELETAL:no cyanosis of digits and no clubbing  NEURO: alert & oriented x 3 with fluent speech, no focal motor/sensory deficits EXTREMITIES: No lower extremity edema  LABORATORY DATA:  I have reviewed the data as listed CMP Latest Ref Rng & Units 11/18/2017 11/11/2017 10/28/2017  Glucose 70 - 140 mg/dl 84 123 102  BUN 7.0 - 26.0 mg/dL 4.3(L) 6.3(L) 5.8(L)  Creatinine 0.6 - 1.1 mg/dL 0.8 0.8 0.8  Sodium 136 - 145 mEq/L 139 140 138  Potassium 3.5 - 5.1 mEq/L 3.7 3.6 3.5  Chloride 101 - 111 mmol/L - - -  CO2 22 - 29 mEq/L _0 Calcium 8.4 - 10.4 mg/dL 9.1 8.8 8.8  Total Protein 6.4 - 8.3 g/dL 7.4 6.8 6.9  Total Bilirubin 0.20 - 1.20 mg/dL 0.47 0.43 0.30  Alkaline Phos 40 - 150 U/L 79 79 80  AST 5 - 34 U/L 36(H) 25 30  ALT 0 - 55 U/L 44 26 45    Lab Results  Component Value Date   WBC 4.2 12/02/2017   HGB 10.3 (L) 12/02/2017   HCT 31.7 (L) 12/02/2017   MCV 90.6 12/02/2017   PLT 168 12/02/2017   NEUTROABS 2.3 12/02/2017    ASSESSMENT & PLAN:  Malignant neoplasm of lower-outer quadrant  of left breast of female, estrogen receptor negative ([place]) 08/29/2017: Left breast 5:00 position: 2.7 cm mass, left axillary LN 3.5 cm, adjacent lymph node 0.9 cm, third lymph node 1.1 cm; Biopsy IDC grade 3 with DCIS, 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  Treatment plan: 1. Neoadjuvant chemotherapy with Gemcitabine and Carboplatin started 09/09/2017 (chosen due to prior Adriamycin treatment and Taxol related severe neuropathy) 2. Followed by breast conserving surgery with targeted no dissection if possible 3. Followed by radiation  No evidence of distant metastasis on bone scan and CT chest, abdomen, and pelvis from 09/13/2017.  ------------------------------------------------------------------------------------------------------------------------ Current treatment: Cycle 5 day 1 carboplatin and gemcitabine  Chemo toxicities: 1.Fatigue 2.mild nausea 3.  Abdominal cramps: I discussed with her about taking Bentyl.  She does not want to take any more medications. 4.  Diarrhea due to chemotherapy: However she does not want to take Imodium because it does not taste good.  I discussed with her about taking Imodium pills instead of liquid.  She wants to do it naturally as much as possible.  Unfortunately this  means that she may continue to have diarrhea.  Mammogram and [place]: Response to neoadj chemo After 6 cycles, plan to do an MRI  I will request Dr. Barry Dienes to see her after the MRI.   I spent 25 minutes talking to the patient of which more than half was spent in counseling and coordination of care.  No orders of the defined types were placed in this encounter.  The patient has a good understanding of the overall plan. she agrees with it. she will call with any problems that may develop before the next visit here.   Harriette Ohara, MD 12/02/17

## 2017-12-02 NOTE — Patient Instructions (Signed)
[place] [facility] Discharge Instructions for Patients Receiving Chemotherapy  Today you received the following chemotherapy agents gemcitabine (Gemzar) and Carboplatin  To help prevent nausea and vomiting after your treatment, we encourage you to take your nausea medication as directed.   If you develop nausea and vomiting that is not controlled by your nausea medication, call the clinic.   BELOW ARE SYMPTOMS THAT SHOULD BE REPORTED IMMEDIATELY:  *FEVER GREATER THAN 100.5 F  *CHILLS WITH OR WITHOUT FEVER  NAUSEA AND VOMITING THAT IS NOT CONTROLLED WITH YOUR NAUSEA MEDICATION  *UNUSUAL SHORTNESS OF BREATH  *UNUSUAL BRUISING OR BLEEDING  TENDERNESS IN MOUTH AND THROAT WITH OR WITHOUT PRESENCE OF ULCERS  *URINARY PROBLEMS  *BOWEL PROBLEMS  UNUSUAL RASH Items with * indicate a potential emergency and should be followed up as soon as possible.  Feel free to call the clinic should you have any questions or concerns. The clinic phone number is [phone].  Please show the [place] at check-in to the Emergency Department and triage nurse.

## 2017-12-02 NOTE — Patient Instructions (Signed)
Implanted Port Home Guide An implanted port is a type of central line that is placed under the skin. Central lines are used to provide IV access when treatment or nutrition needs to be given through a person's veins. Implanted ports are used for long-term IV access. An implanted port may be placed because:  You need IV medicine that would be irritating to the small veins in your hands or arms.  You need long-term IV medicines, such as antibiotics.  You need IV nutrition for a long period.  You need frequent blood draws for lab tests.  You need dialysis.  Implanted ports are usually placed in the chest area, but they can also be placed in the upper arm, the abdomen, or the leg. An implanted port has two main parts:  Reservoir. The reservoir is round and will appear as a small, raised area under your skin. The reservoir is the part where a needle is inserted to give medicines or draw blood.  Catheter. The catheter is a thin, flexible tube that extends from the reservoir. The catheter is placed into a large vein. Medicine that is inserted into the reservoir goes into the catheter and then into the vein.  How will I care for my incision site? Do not get the incision site wet. Bathe or shower as directed by your health care provider. How is my port accessed? Special steps must be taken to access the port:  Before the port is accessed, a numbing cream can be placed on the skin. This helps numb the skin over the port site.  Your health care provider uses a sterile technique to access the port. ? Your health care provider must put on a mask and sterile gloves. ? The skin over your port is cleaned carefully with an antiseptic and allowed to dry. ? The port is gently pinched between sterile gloves, and a needle is inserted into the port.  Only "non-coring" port needles should be used to access the port. Once the port is accessed, a blood return should be checked. This helps ensure that the port  is in the vein and is not clogged.  If your port needs to remain accessed for a constant infusion, a clear (transparent) bandage will be placed over the needle site. The bandage and needle will need to be changed every week, or as directed by your health care provider.  Keep the bandage covering the needle clean and dry. Do not get it wet. Follow your health care provider's instructions on how to take a shower or bath while the port is accessed.  If your port does not need to stay accessed, no bandage is needed over the port.  What is flushing? Flushing helps keep the port from getting clogged. Follow your health care provider's instructions on how and when to flush the port. Ports are usually flushed with saline solution or a medicine called heparin. The need for flushing will depend on how the port is used.  If the port is used for intermittent medicines or blood draws, the port will need to be flushed: ? After medicines have been given. ? After blood has been drawn. ? As part of routine maintenance.  If a constant infusion is running, the port may not need to be flushed.  How long will my port stay implanted? The port can stay in for as long as your health care provider thinks it is needed. When it is time for the port to come out, surgery will be   done to remove it. The procedure is similar to the one performed when the port was put in. When should I seek immediate medical care? When you have an implanted port, you should seek immediate medical care if:  You notice a bad smell coming from the incision site.  You have swelling, redness, or drainage at the incision site.  You have more swelling or pain at the port site or the surrounding area.  You have a fever that is not controlled with medicine.  This information is not intended to replace advice given to you by your health care provider. Make sure you discuss any questions you have with your health care provider. Document  Released: 11/08/2005 Document Revised: 04/15/2016 Document Reviewed: 07/16/2013 Elsevier Interactive Patient Education  2017 [employer]  

## 2017-12-07 ENCOUNTER — Other Ambulatory Visit: Payer: Self-pay | Admitting: *Deleted

## 2017-12-07 DIAGNOSIS — Z171 Estrogen receptor negative status [ER-]: Principal | ICD-10-CM

## 2017-12-07 DIAGNOSIS — C50512 Malignant neoplasm of lower-outer quadrant of left female breast: Secondary | ICD-10-CM

## 2017-12-09 ENCOUNTER — Inpatient Hospital Stay: Payer: BC Managed Care – PPO

## 2017-12-09 VITALS — BP 139/95 | HR 79 | Temp 98.8°F | Resp 19

## 2017-12-09 DIAGNOSIS — C50512 Malignant neoplasm of lower-outer quadrant of left female breast: Secondary | ICD-10-CM

## 2017-12-09 DIAGNOSIS — Z171 Estrogen receptor negative status [ER-]: Principal | ICD-10-CM

## 2017-12-09 DIAGNOSIS — Z5111 Encounter for antineoplastic chemotherapy: Secondary | ICD-10-CM | POA: Diagnosis not present

## 2017-12-09 LAB — COMPREHENSIVE METABOLIC PANEL
ALBUMIN: 4.4 g/dL (ref 3.5–5.0)
ALK PHOS: 84 U/L (ref 40–150)
ALT: 70 U/L — ABNORMAL HIGH (ref 0–55)
AST: 64 U/L — ABNORMAL HIGH (ref 5–34)
Anion gap: 12 — ABNORMAL HIGH (ref 3–11)
BILIRUBIN TOTAL: 1.3 mg/dL — AB (ref 0.2–1.2)
BUN: 8 mg/dL (ref 7–26)
CALCIUM: 9.3 mg/dL (ref 8.4–10.4)
CO2: 24 mmol/L (ref 22–29)
Chloride: 103 mmol/L (ref 98–109)
Creatinine, Ser: 0.88 mg/dL (ref 0.60–1.10)
GFR calc Af Amer: >60 mL/min (ref >60)
GFR calc non Af Amer: >60 mL/min (ref >60)
GLUCOSE: 95 mg/dL (ref 70–140)
Potassium: 3.1 mmol/L — ABNORMAL LOW (ref 3.3–4.7)
Sodium: 139 mmol/L (ref 136–145)
TOTAL PROTEIN: 7.6 g/dL (ref 6.4–8.3)

## 2017-12-09 LAB — CBC WITH DIFFERENTIAL/PLATELET
BASOS ABS: 0 10*3/uL (ref 0.0–0.1)
BASOS PCT: 0 %
EOS ABS: 0 10*3/uL (ref 0.0–0.5)
EOS PCT: 0 %
HEMATOCRIT: 30.2 % — AB (ref 34.8–46.6)
Hemoglobin: 9.8 g/dL — ABNORMAL LOW (ref 11.6–15.9)
Lymphocytes Relative: 44 %
Lymphs Abs: 1.6 10*3/uL (ref 0.9–3.3)
MCH: 29.8 pg (ref 25.1–34.0)
MCHC: 32.5 g/dL (ref 31.5–36.0)
MCV: 91.8 fL (ref 79.5–101.0)
MONO ABS: 0.2 10*3/uL (ref 0.1–0.9)
Monocytes Relative: 6 %
NEUTROS ABS: 1.8 10*3/uL (ref 1.5–6.5)
Neutrophils Relative %: 50 %
PLATELETS: 204 10*3/uL (ref 145–400)
RBC: 3.29 MIL/uL — ABNORMAL LOW (ref 3.70–5.45)
RDW: 21 % — ABNORMAL HIGH (ref 11.2–16.1)
WBC: 3.7 10*3/uL — ABNORMAL LOW (ref 3.9–10.3)

## 2017-12-09 MED ORDER — DEXAMETHASONE SODIUM PHOSPHATE 10 MG/ML IJ SOLN
10.0000 mg | Freq: Once | INTRAMUSCULAR | Status: AC
  Administered 2017-12-09: 10 mg via INTRAVENOUS

## 2017-12-09 MED ORDER — SODIUM CHLORIDE 0.9 % IV SOLN
Freq: Once | INTRAVENOUS | Status: AC
  Administered 2017-12-09: 12:00:00 via INTRAVENOUS

## 2017-12-09 MED ORDER — PALONOSETRON HCL INJECTION 0.25 MG/5ML
0.2500 mg | Freq: Once | INTRAVENOUS | Status: AC
  Administered 2017-12-09: 0.25 mg via INTRAVENOUS

## 2017-12-09 MED ORDER — DEXAMETHASONE SODIUM PHOSPHATE 10 MG/ML IJ SOLN
INTRAMUSCULAR | Status: AC
  Filled 2017-12-09: qty 1

## 2017-12-09 MED ORDER — PALONOSETRON HCL INJECTION 0.25 MG/5ML
INTRAVENOUS | Status: AC
  Filled 2017-12-09: qty 5

## 2017-12-09 MED ORDER — SODIUM CHLORIDE 0.9 % IV SOLN
1000.0000 mg/m2 | Freq: Once | INTRAVENOUS | Status: AC
  Administered 2017-12-09: 1976 mg via INTRAVENOUS
  Filled 2017-12-09: qty 51.97

## 2017-12-09 MED ORDER — SODIUM CHLORIDE 0.9 % IV SOLN
237.6000 mg | Freq: Once | INTRAVENOUS | Status: AC
  Administered 2017-12-09: 240 mg via INTRAVENOUS
  Filled 2017-12-09: qty 24

## 2017-12-09 MED ORDER — HEPARIN SOD (PORK) LOCK FLUSH 100 UNIT/ML IV SOLN
500.0000 [IU] | Freq: Once | INTRAVENOUS | Status: AC | PRN
  Administered 2017-12-09: 500 [IU]
  Filled 2017-12-09: qty 5

## 2017-12-09 MED ORDER — SODIUM CHLORIDE 0.9% FLUSH
10.0000 mL | INTRAVENOUS | Status: DC | PRN
  Administered 2017-12-09: 10 mL
  Filled 2017-12-09: qty 10

## 2017-12-09 NOTE — Patient Instructions (Signed)
[facility] Discharge Instructions for Patients Receiving Chemotherapy  Today you received the following chemotherapy agents Gemzar and Carboplatin   To help prevent nausea and vomiting after your treatment, we encourage you to take your nausea medication as directed.    If you develop nausea and vomiting that is not controlled by your nausea medication, call the clinic.   BELOW ARE SYMPTOMS THAT SHOULD BE REPORTED IMMEDIATELY:  *FEVER GREATER THAN 100.5 F  *CHILLS WITH OR WITHOUT FEVER  NAUSEA AND VOMITING THAT IS NOT CONTROLLED WITH YOUR NAUSEA MEDICATION  *UNUSUAL SHORTNESS OF BREATH  *UNUSUAL BRUISING OR BLEEDING  TENDERNESS IN MOUTH AND THROAT WITH OR WITHOUT PRESENCE OF ULCERS  *URINARY PROBLEMS  *BOWEL PROBLEMS  UNUSUAL RASH Items with * indicate a potential emergency and should be followed up as soon as possible.  Feel free to call the clinic should you have any questions or concerns. The clinic phone number is [phone].  Please show the CHEMO ALERT CARD at check-in to the Emergency Department and triage nurse.   

## 2017-12-15 ENCOUNTER — Ambulatory Visit (HOSPITAL_COMMUNITY): Payer: BC Managed Care – PPO

## 2017-12-22 NOTE — Assessment & Plan Note (Signed)
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  Treatment plan: 1. Neoadjuvant chemotherapy with Gemcitabine and Carboplatin started 09/09/2017 (chosen due to prior Adriamycin treatment and Taxol related severe neuropathy) 2. Followed by breast conserving surgery with targeted no dissection if possible 3. Followed by radiation  No evidence of distant metastasis on bone scan and CT chest, abdomen, and pelvis from 09/13/2017.  ------------------------------------------------------------------------------------------------------------------------ Current treatment: Cycle6 day 1carboplatin and gemcitabine  Chemo toxicities: 1.Fatigue 2.mild nausea 3.Abdominal cramps: I discussed with her about taking Bentyl. She does not want to take any more medications. 4.  Diarrhea due to chemotherapy:   After 6 cycles, plan to do an MRI  I will request Dr. Byerly to see her after the MRI.   

## 2017-12-23 ENCOUNTER — Inpatient Hospital Stay: Payer: BC Managed Care – PPO

## 2017-12-23 ENCOUNTER — Telehealth: Payer: Self-pay

## 2017-12-23 ENCOUNTER — Inpatient Hospital Stay: Payer: BC Managed Care – PPO | Attending: Hematology and Oncology | Admitting: Hematology and Oncology

## 2017-12-23 DIAGNOSIS — C50512 Malignant neoplasm of lower-outer quadrant of left female breast: Secondary | ICD-10-CM

## 2017-12-23 DIAGNOSIS — Z171 Estrogen receptor negative status [ER-]: Secondary | ICD-10-CM | POA: Diagnosis not present

## 2017-12-23 DIAGNOSIS — R0789 Other chest pain: Secondary | ICD-10-CM | POA: Insufficient documentation

## 2017-12-23 DIAGNOSIS — Z5111 Encounter for antineoplastic chemotherapy: Secondary | ICD-10-CM | POA: Diagnosis present

## 2017-12-23 DIAGNOSIS — C773 Secondary and unspecified malignant neoplasm of axilla and upper limb lymph nodes: Secondary | ICD-10-CM | POA: Diagnosis not present

## 2017-12-23 LAB — CBC WITH DIFFERENTIAL/PLATELET
BASOS ABS: 0 10*3/uL (ref 0.0–0.1)
BASOS PCT: 0 %
Eosinophils Absolute: 0.1 10*3/uL (ref 0.0–0.5)
Eosinophils Relative: 2 %
HCT: 27.7 % — ABNORMAL LOW (ref 34.8–46.6)
Hemoglobin: 8.9 g/dL — ABNORMAL LOW (ref 11.6–15.9)
Lymphocytes Relative: 40 %
Lymphs Abs: 1.4 10*3/uL (ref 0.9–3.3)
MCH: 30.7 pg (ref 25.1–34.0)
MCHC: 32.1 g/dL (ref 31.5–36.0)
MCV: 95.5 fL (ref 79.5–101.0)
MONO ABS: 0.6 10*3/uL (ref 0.1–0.9)
Monocytes Relative: 18 %
NEUTROS ABS: 1.4 10*3/uL — AB (ref 1.5–6.5)
Neutrophils Relative %: 40 %
Platelets: 141 10*3/uL — ABNORMAL LOW (ref 145–400)
RBC: 2.9 MIL/uL — ABNORMAL LOW (ref 3.70–5.45)
RDW: 21.2 % — AB (ref 11.2–14.5)
WBC: 3.5 10*3/uL — ABNORMAL LOW (ref 3.9–10.3)

## 2017-12-23 LAB — COMPREHENSIVE METABOLIC PANEL
ALBUMIN: 4 g/dL (ref 3.5–5.0)
ALK PHOS: 76 U/L (ref 40–150)
ALT: 46 U/L (ref 0–55)
ANION GAP: 9 (ref 3–11)
AST: 43 U/L — ABNORMAL HIGH (ref 5–34)
BILIRUBIN TOTAL: 0.8 mg/dL (ref 0.2–1.2)
BUN: 8 mg/dL (ref 7–26)
CALCIUM: 8.9 mg/dL (ref 8.4–10.4)
CO2: 25 mmol/L (ref 22–29)
Chloride: 106 mmol/L (ref 98–109)
Creatinine, Ser: 0.81 mg/dL (ref 0.60–1.10)
GFR calc Af Amer: >60 mL/min (ref >60)
GFR calc non Af Amer: >60 mL/min (ref >60)
GLUCOSE: 74 mg/dL (ref 70–140)
Potassium: 3.3 mmol/L — ABNORMAL LOW (ref 3.5–5.1)
Sodium: 140 mmol/L (ref 136–145)
TOTAL PROTEIN: 7 g/dL (ref 6.4–8.3)

## 2017-12-23 MED ORDER — SODIUM CHLORIDE 0.9 % IV SOLN
1000.0000 mg/m2 | Freq: Once | INTRAVENOUS | Status: AC
  Administered 2017-12-23: 1976 mg via INTRAVENOUS
  Filled 2017-12-23: qty 51.97

## 2017-12-23 MED ORDER — DEXAMETHASONE SODIUM PHOSPHATE 10 MG/ML IJ SOLN
INTRAMUSCULAR | Status: AC
  Filled 2017-12-23: qty 1

## 2017-12-23 MED ORDER — PALONOSETRON HCL INJECTION 0.25 MG/5ML
0.2500 mg | Freq: Once | INTRAVENOUS | Status: AC
  Administered 2017-12-23: 0.25 mg via INTRAVENOUS

## 2017-12-23 MED ORDER — SODIUM CHLORIDE 0.9 % IV SOLN
253.8000 mg | Freq: Once | INTRAVENOUS | Status: AC
  Administered 2017-12-23: 250 mg via INTRAVENOUS
  Filled 2017-12-23: qty 25

## 2017-12-23 MED ORDER — SODIUM CHLORIDE 0.9 % IV SOLN
Freq: Once | INTRAVENOUS | Status: AC
  Administered 2017-12-23: 13:00:00 via INTRAVENOUS

## 2017-12-23 MED ORDER — DEXAMETHASONE SODIUM PHOSPHATE 10 MG/ML IJ SOLN
10.0000 mg | Freq: Once | INTRAMUSCULAR | Status: AC
  Administered 2017-12-23: 10 mg via INTRAVENOUS

## 2017-12-23 MED ORDER — SODIUM CHLORIDE 0.9% FLUSH
10.0000 mL | INTRAVENOUS | Status: DC | PRN
  Administered 2017-12-23: 10 mL
  Filled 2017-12-23: qty 10

## 2017-12-23 MED ORDER — PALONOSETRON HCL INJECTION 0.25 MG/5ML
INTRAVENOUS | Status: AC
  Filled 2017-12-23: qty 5

## 2017-12-23 MED ORDER — HEPARIN SOD (PORK) LOCK FLUSH 100 UNIT/ML IV SOLN
500.0000 [IU] | Freq: Once | INTRAVENOUS | Status: AC | PRN
  Administered 2017-12-23: 500 [IU]
  Filled 2017-12-23: qty 5

## 2017-12-23 NOTE — Telephone Encounter (Signed)
Ok to treat with todays labs per Dr. Lindi Adie.  Infusion RN aware. She is going to give information on how to increase potassium in diet.  Cyndia Bent RN

## 2017-12-23 NOTE — Progress Notes (Signed)
Patient Care Team: Patient, No Pcp Per as PCP - General (General Practice) Stark Klein, MD as Consulting Physician (General Surgery)  DIAGNOSIS:  Encounter Diagnosis  Name Primary?  . Malignant neoplasm of lower-outer quadrant of left breast of female, estrogen receptor negative ([place])     SUMMARY OF ONCOLOGIC HISTORY:   Malignant neoplasm of lower-outer quadrant of left breast of female, estrogen receptor negative ([place])   2003 Initial Biopsy    Right breast cancer diagnosed and treated at [facility] hormone receptor positive, 30 positive axillary lymph nodes modified radical mastectomy followed by chemotherapy, did not take radiation on antiestrogen therapy      08/29/2017 Initial Diagnosis    Left breast 5:00 position: 2.7 cm mass, left axillary LN 3.5 cm, adjacent lymph node 0.9 cm, third lymph node 1.1 cm; Biopsy IDC grade 3 with DCIS, 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  This is the last cycle of chemo.  She has tolerated chemotherapy extremely well.  Unfortunately she patient thinks that the tumor has not shrunk in size.  She is very nervous about the upcoming breast MRI.  She appears to think that marijuana has helped her with her symptoms and enable her to tolerate chemo.  REVIEW OF SYSTEMS:   Constitutional: Denies fevers, chills or abnormal weight loss Eyes: Denies blurriness of vision Ears, nose, mouth, throat, and face: Denies mucositis or sore throat Respiratory: Denies cough, dyspnea or wheezes Cardiovascular: Denies palpitation, chest  discomfort Gastrointestinal:  Denies nausea, heartburn or change in bowel habits Skin: Denies abnormal skin rashes Lymphatics: Denies new lymphadenopathy or easy bruising Neurological:Denies numbness, tingling or new weaknesses Behavioral/Psych: Mood is stable, no new changes  Extremities: No lower extremity edema  All other systems were reviewed with the patient and are negative.  I have reviewed the past medical history, past surgical history, social history and family history with the patient and they are unchanged from previous note.  ALLERGIES:  is allergic to olive oil and penicillins.  MEDICATIONS:  Current Outpatient Medications  Medication Sig Dispense Refill  . lidocaine-prilocaine (EMLA) cream Apply to affected area once 30 g 3  . naproxen sodium (ANAPROX) 220 MG tablet Take 220-440 mg by mouth daily as needed (headache).    . ondansetron (ZOFRAN) 8 MG tablet Take 1 tablet (8 mg total) by mouth 2 (two) times daily as needed for refractory nausea / vomiting. Start on day 3 after chemotherapy. (Patient not taking: Reported on 09/16/2017) 30 tablet 1  . oxyCODONE (OXY IR/ROXICODONE) 5 MG immediate release tablet Take 1-2 tablets (5-10 mg total) by mouth every 6 (six) hours as needed for moderate pain, severe pain or breakthrough pain. (Patient not taking: Reported on 09/16/2017) 10 tablet 0  . prochlorperazine (COMPAZINE) 10 MG tablet Take 1 tablet (10 mg total) by mouth every 6 (six) hours as needed (Nausea or vomiting). (Patient not taking: Reported on 09/16/2017) 30 tablet 1  . Pseudoeph-Doxylamine-DM-APAP (DAYQUIL/NYQUIL COLD/FLU RELIEF PO) Take 1 Dose by mouth daily as needed (flu symptoms).    . traMADol (ULTRAM) 50 MG tablet Take 1  tablet (50 mg total) by mouth every 6 (six) hours as needed for moderate pain. (Patient not taking: Reported on 09/05/2017) 30 tablet 0   No current facility-administered medications for this visit.     PHYSICAL EXAMINATION: ECOG PERFORMANCE  STATUS: 1 - Symptomatic but completely ambulatory  Vitals:   12/23/17 1140  BP: 117/82  Pulse: 83  Resp: 18  Temp: 98.7 F (37.1 C)  SpO2: 100%   Filed Weights   12/23/17 1140  Weight: 170 lb 3.2 oz (77.2 kg)    GENERAL:alert, no distress and comfortable SKIN: skin color, texture, turgor are normal, no rashes or significant lesions EYES: normal, Conjunctiva are pink and non-injected, sclera clear OROPHARYNX:no exudate, no erythema and lips, buccal mucosa, and tongue normal  NECK: supple, thyroid normal size, non-tender, without nodularity LYMPH:  no palpable lymphadenopathy in the cervical, axillary or inguinal LUNGS: clear to auscultation and percussion with normal breathing effort HEART: regular rate & rhythm and no murmurs and no lower extremity edema ABDOMEN:abdomen soft, non-tender and normal bowel sounds MUSCULOSKELETAL:no cyanosis of digits and no clubbing  NEURO: alert & oriented x 3 with fluent speech, no focal motor/sensory deficits EXTREMITIES: No lower extremity edema   LABORATORY DATA:  I have reviewed the data as listed CMP Latest Ref Rng & Units 12/09/2017 12/02/2017 11/18/2017  Glucose 70 - 140 mg/dL 95 87 84  BUN 7 - 26 mg/dL 8 7 4.3(L)  Creatinine 0.60 - 1.10 mg/dL 0.88 0.81 0.8  Sodium 136 - 145 mmol/L 139 139 139  Potassium 3.3 - 4.7 mmol/L 3.1(L) 3.7 3.7  Chloride 98 - 109 mmol/L 103 105 -  CO2 22 - 29 mmol/L '24 25 25  ' Calcium 8.4 - 10.4 mg/dL 9.3 9.2 9.1  Total Protein 6.4 - 8.3 g/dL 7.6 7.3 7.4  Total Bilirubin 0.2 - 1.2 mg/dL 1.3(H) 0.6 0.47  Alkaline Phos 40 - 150 U/L 84 77 79  AST 5 - 34 U/L 64(H) 35(H) 36(H)  ALT 0 - 55 U/L 70(H) 35 44    Lab Results  Component Value Date   WBC 3.5 (L) 12/23/2017   HGB 8.9 (L) 12/23/2017   HCT 27.7 (L) 12/23/2017   MCV 95.5 12/23/2017   PLT 141 (L) 12/23/2017   NEUTROABS PENDING 12/23/2017    ASSESSMENT & PLAN:  Malignant neoplasm of lower-outer quadrant of left breast of female, estrogen receptor  negative ([place]) 08/29/2017: Left breast 5:00 position: 2.7 cm mass, left axillary LN 3.5 cm, adjacent lymph node 0.9 cm, third lymph node 1.1 cm; Biopsy IDC grade 3 with DCIS, 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  Treatment plan: 1. Neoadjuvant chemotherapy with Gemcitabine and Carboplatin started 09/09/2017 (chosen due to prior Adriamycin treatment and Taxol related severe neuropathy) 2. Followed by breast conserving surgery with targeted no dissection if possible 3. Followed by radiation  No evidence of distant metastasis on bone scan and CT chest, abdomen, and pelvis from 09/13/2017.  ------------------------------------------------------------------------------------------------------------------------ Current treatment: Cycle6 day 1carboplatin and gemcitabine  Chemo toxicities: 1.Fatigue 2.mild nausea 3.Abdominal cramps: I discussed with her about taking Bentyl. She does not want to take any more medications. 4.  Diarrhea due to chemotherapy:   After 6 cycles, plan to do an MRI  The patient has appointments to see Dr. Barry Dienes. She is trying to find a Psychiatric nurse and may be seeing a couple of plastic surgeons to see who she wants to go with. Return to clinic 1 week after  surgery to discuss pathology report.  I spent 25 minutes talking to the patient of which more than half was spent in counseling and coordination of care.  No orders of the defined types were placed in this encounter.  The patient has a good understanding of the overall plan. she agrees with it. she will call with any problems that may develop before the next visit here.   Harriette Ohara, MD 12/23/17

## 2017-12-23 NOTE — Patient Instructions (Signed)

## 2017-12-23 NOTE — Patient Instructions (Signed)
[facility] Discharge Instructions for Patients Receiving Chemotherapy  Today you received the following chemotherapy agents: Gemzar and Carboplatin  To help prevent nausea and vomiting after your treatment, we encourage you to take your nausea medication as directed   If you develop nausea and vomiting that is not controlled by your nausea medication, call the clinic.   BELOW ARE SYMPTOMS THAT SHOULD BE REPORTED IMMEDIATELY:  *FEVER GREATER THAN 100.5 F  *CHILLS WITH OR WITHOUT FEVER  NAUSEA AND VOMITING THAT IS NOT CONTROLLED WITH YOUR NAUSEA MEDICATION  *UNUSUAL SHORTNESS OF BREATH  *UNUSUAL BRUISING OR BLEEDING  TENDERNESS IN MOUTH AND THROAT WITH OR WITHOUT PRESENCE OF ULCERS  *URINARY PROBLEMS  *BOWEL PROBLEMS  UNUSUAL RASH Items with * indicate a potential emergency and should be followed up as soon as possible.  Feel free to call the clinic should you have any questions or concerns. The clinic phone number is [phone].  Please show the [facility] at check-in to the Emergency Department and triage nurse.     Hypokalemia Hypokalemia means that the amount of potassium in the blood is lower than normal.Potassium is a chemical that helps regulate the amount of fluid in the body (electrolyte). It also stimulates muscle tightening (contraction) and helps nerves work properly.Normally, most of the body's potassium is inside of cells, and only a very small amount is in the blood. Because the amount in the blood is so small, minor changes to potassium levels in the blood can be life-threatening. What are the causes? This condition may be caused by:  Antibiotic medicine.  Diarrhea or vomiting. Taking too much of a medicine that helps you have a bowel movement (laxative) can cause diarrhea and lead to hypokalemia.  Chronic kidney disease (CKD).  Medicines that help the body get rid of excess fluid (diuretics).  Eating disorders, such as  bulimia.  Low magnesium levels in the body.  Sweating a lot.  What are the signs or symptoms? Symptoms of this condition include:  Weakness.  Constipation.  Fatigue.  Muscle cramps.  Mental confusion.  Skipped heartbeats or irregular heartbeat (palpitations).  Tingling or numbness.  How is this diagnosed? This condition is diagnosed with a blood test. How is this treated? Hypokalemia can be treated by taking potassium supplements by mouth or adjusting the medicines that you take. Treatment may also include eating more foods that contain a lot of potassium. If your potassium level is very low, you may need to get potassium through an IV tube in one of your veins and be monitored in the hospital. Follow these instructions at home:  Take over-the-counter and prescription medicines only as told by your health care provider. This includes vitamins and supplements.  Eat a healthy diet. A healthy diet includes fresh fruits and vegetables, whole grains, healthy fats, and lean proteins.  If instructed, eat more foods that contain a lot of potassium, such as: ? Nuts, such as peanuts and pistachios. ? Seeds, such as sunflower seeds and pumpkin seeds. ? Peas, lentils, and lima beans. ? Whole grain and bran cereals and breads. ? Fresh fruits and vegetables, such as apricots, avocado, bananas, cantaloupe, kiwi, oranges, tomatoes, asparagus, and potatoes. ? Orange juice. ? Tomato juice. ? Red meats. ? Yogurt.  Keep all follow-up visits as told by your health care provider. This is important. Contact a health care provider if:  You have weakness that gets worse.  You feel your heart pounding or racing.  You vomit.  You have diarrhea.  You have diabetes (diabetes mellitus) and you have trouble keeping your blood sugar (glucose) in your target range. Get help right away if:  You have chest pain.  You have shortness of breath.  You have vomiting or diarrhea that lasts for  more than 2 days.  You faint. This information is not intended to replace advice given to you by your health care provider. Make sure you discuss any questions you have with your health care provider. Document Released: 11/08/2005 Document Revised: 06/26/2016 Document Reviewed: 06/26/2016 Elsevier Interactive Patient Education  2018 [employer].

## 2017-12-30 ENCOUNTER — Inpatient Hospital Stay: Payer: BC Managed Care – PPO

## 2017-12-30 VITALS — BP 147/105 | HR 80 | Temp 98.4°F | Resp 18

## 2017-12-30 DIAGNOSIS — C50512 Malignant neoplasm of lower-outer quadrant of left female breast: Secondary | ICD-10-CM

## 2017-12-30 DIAGNOSIS — Z5111 Encounter for antineoplastic chemotherapy: Secondary | ICD-10-CM | POA: Diagnosis not present

## 2017-12-30 DIAGNOSIS — Z171 Estrogen receptor negative status [ER-]: Principal | ICD-10-CM

## 2017-12-30 MED ORDER — PALONOSETRON HCL INJECTION 0.25 MG/5ML
INTRAVENOUS | Status: AC
  Filled 2017-12-30: qty 5

## 2017-12-30 MED ORDER — PALONOSETRON HCL INJECTION 0.25 MG/5ML
0.2500 mg | Freq: Once | INTRAVENOUS | Status: AC
  Administered 2017-12-30: 0.25 mg via INTRAVENOUS

## 2017-12-30 MED ORDER — GEMCITABINE HCL CHEMO INJECTION 1 GM/26.3ML
1000.0000 mg/m2 | Freq: Once | INTRAVENOUS | Status: AC
  Administered 2017-12-30: 1976 mg via INTRAVENOUS
  Filled 2017-12-30: qty 51.97

## 2017-12-30 MED ORDER — DEXAMETHASONE SODIUM PHOSPHATE 10 MG/ML IJ SOLN
10.0000 mg | Freq: Once | INTRAMUSCULAR | Status: AC
  Administered 2017-12-30: 10 mg via INTRAVENOUS

## 2017-12-30 MED ORDER — SODIUM CHLORIDE 0.9 % IV SOLN
250.0000 mg | Freq: Once | INTRAVENOUS | Status: AC
  Administered 2017-12-30: 250 mg via INTRAVENOUS
  Filled 2017-12-30: qty 25

## 2017-12-30 MED ORDER — SODIUM CHLORIDE 0.9 % IV SOLN
Freq: Once | INTRAVENOUS | Status: AC
  Administered 2017-12-30: 12:00:00 via INTRAVENOUS

## 2017-12-30 MED ORDER — HEPARIN SOD (PORK) LOCK FLUSH 100 UNIT/ML IV SOLN
500.0000 [IU] | Freq: Once | INTRAVENOUS | Status: AC | PRN
  Administered 2017-12-30: 500 [IU]
  Filled 2017-12-30: qty 5

## 2017-12-30 MED ORDER — SODIUM CHLORIDE 0.9% FLUSH
10.0000 mL | INTRAVENOUS | Status: DC | PRN
  Administered 2017-12-30: 10 mL
  Filled 2017-12-30: qty 10

## 2017-12-30 MED ORDER — DEXAMETHASONE SODIUM PHOSPHATE 10 MG/ML IJ SOLN
INTRAMUSCULAR | Status: AC
  Filled 2017-12-30: qty 1

## 2017-12-30 NOTE — Progress Notes (Signed)
Ok to use labs from 12/23/17 with ANC 1.4 per Dr. Jana Hakim for todays infusion. Infusion RN aware.  Cyndia Bent RN

## 2017-12-30 NOTE — Patient Instructions (Signed)
[facility] Discharge Instructions for Patients Receiving Chemotherapy  Today you received the following chemotherapy agents Carboplatin, Gemzar  To help prevent nausea and vomiting after your treatment, we encourage you to take your nausea medication as needed   If you develop nausea and vomiting that is not controlled by your nausea medication, call the clinic.   BELOW ARE SYMPTOMS THAT SHOULD BE REPORTED IMMEDIATELY:  *FEVER GREATER THAN 100.5 F  *CHILLS WITH OR WITHOUT FEVER  NAUSEA AND VOMITING THAT IS NOT CONTROLLED WITH YOUR NAUSEA MEDICATION  *UNUSUAL SHORTNESS OF BREATH  *UNUSUAL BRUISING OR BLEEDING  TENDERNESS IN MOUTH AND THROAT WITH OR WITHOUT PRESENCE OF ULCERS  *URINARY PROBLEMS  *BOWEL PROBLEMS  UNUSUAL RASH Items with * indicate a potential emergency and should be followed up as soon as possible.  Feel free to call the clinic should you have any questions or concerns. The clinic phone number is [phone].  Please show the [place] at check-in to the Emergency Department and triage nurse.

## 2018-01-02 ENCOUNTER — Ambulatory Visit (HOSPITAL_COMMUNITY)
Admission: RE | Admit: 2018-01-02 | Discharge: 2018-01-02 | Disposition: A | Discharge status: Home / Self Care | Payer: BC Managed Care – PPO | Source: Ambulatory Visit | Attending: Hematology and Oncology | Admitting: Hematology and Oncology

## 2018-01-02 DIAGNOSIS — Z9011 Acquired absence of right breast and nipple: Secondary | ICD-10-CM | POA: Insufficient documentation

## 2018-01-02 DIAGNOSIS — C50512 Malignant neoplasm of lower-outer quadrant of left female breast: Secondary | ICD-10-CM | POA: Diagnosis present

## 2018-01-02 DIAGNOSIS — Z171 Estrogen receptor negative status [ER-]: Secondary | ICD-10-CM | POA: Diagnosis not present

## 2018-01-02 MED ORDER — GADOBENATE DIMEGLUMINE 529 MG/ML IV SOLN
20.0000 mL | Freq: Once | INTRAVENOUS | Status: AC | PRN
  Administered 2018-01-02: 16 mL via INTRAVENOUS

## 2018-01-16 ENCOUNTER — Telehealth: Payer: Self-pay | Admitting: *Deleted

## 2018-01-16 NOTE — Telephone Encounter (Signed)
Faxed medical records to [phone] 01/16/18 @ 12:19pm.

## 2018-01-18 ENCOUNTER — Telehealth: Payer: Self-pay

## 2018-01-18 NOTE — Telephone Encounter (Signed)
Returned pt call regarding concerns with L breast pain. She described it as a shooting pain. Patient has completed chemotherapy and is currently looking for a surgeon that fits her needs. I scheduled her to see Mendel Ryder, NP tomorrow to have this pain looked into. Pt aware of date/time. No further questions at this time.  Cyndia Bent RN

## 2018-01-18 NOTE — Telephone Encounter (Signed)
Appointment scheduled.

## 2018-01-19 ENCOUNTER — Inpatient Hospital Stay (HOSPITAL_BASED_OUTPATIENT_CLINIC_OR_DEPARTMENT_OTHER): Payer: BC Managed Care – PPO | Admitting: Adult Health

## 2018-01-19 ENCOUNTER — Encounter: Payer: Self-pay | Admitting: Adult Health

## 2018-01-19 ENCOUNTER — Ambulatory Visit (HOSPITAL_COMMUNITY)
Admission: RE | Admit: 2018-01-19 | Discharge: 2018-01-19 | Disposition: A | Discharge status: Home / Self Care | Payer: BC Managed Care – PPO | Source: Ambulatory Visit | Attending: Adult Health | Admitting: Adult Health

## 2018-01-19 VITALS — BP 144/101 | HR 61 | Temp 97.7°F | Resp 17 | Ht 67.0 in | Wt 172.0 lb

## 2018-01-19 DIAGNOSIS — Z95828 Presence of other vascular implants and grafts: Secondary | ICD-10-CM | POA: Insufficient documentation

## 2018-01-19 DIAGNOSIS — C773 Secondary and unspecified malignant neoplasm of axilla and upper limb lymph nodes: Secondary | ICD-10-CM | POA: Diagnosis not present

## 2018-01-19 DIAGNOSIS — Z5111 Encounter for antineoplastic chemotherapy: Secondary | ICD-10-CM | POA: Diagnosis not present

## 2018-01-19 DIAGNOSIS — C50512 Malignant neoplasm of lower-outer quadrant of left female breast: Secondary | ICD-10-CM | POA: Insufficient documentation

## 2018-01-19 DIAGNOSIS — R0789 Other chest pain: Secondary | ICD-10-CM | POA: Diagnosis not present

## 2018-01-19 DIAGNOSIS — Z171 Estrogen receptor negative status [ER-]: Secondary | ICD-10-CM | POA: Diagnosis not present

## 2018-01-19 NOTE — Assessment & Plan Note (Addendum)
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  Treatment plan: 1. Neoadjuvant chemotherapy with Gemcitabine and Carboplatin x 6 cycles completed 01/02/18(chosen due to prior Adriamycin treatment and Taxol related severe neuropathy) 2. Followed by breast conserving surgery with targeted node dissection if possible 3. Followed by radiation  No evidence of distant metastasis on bone scan and CT chest, abdomen, and pelvis from 09/13/2017.  ------------------------------------------------------------------------------------------------------------------------  Suzanne May is doing moderately well.  She has two upcoming appointments for additional surgical opinions in regards to her breasts.  I cannot give her a definite answer for the cause of her pain.  It could be shingles prodrome, musculoskeletal strain, or her breast cancer.  We reviewed that.  I will get a chest xray today.  As she is getting two other surgical opinions, I will defer to her surgeons if they want to re image the breasts if the pain continues.  I will call her with her chest xray results.  I encouraged her to go ahead and get her surgery scheduled.  She verbalized understanding.    
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
## 2018-01-20 ENCOUNTER — Telehealth: Payer: Self-pay | Admitting: Adult Health

## 2018-01-20 NOTE — Telephone Encounter (Signed)
Per 2/28 no los °

## 2018-01-25 ENCOUNTER — Telehealth: Payer: Self-pay | Admitting: Hematology and Oncology

## 2018-01-25 NOTE — Telephone Encounter (Signed)
FAXED RECORDS TO [facility] RELEASE ID [id number]

## 2018-02-27 NOTE — Unmapped External Note (Signed)
 Was made in the left breast and the skin of the left breast was made in a transverse elliptical incision operative Note (CSN: [id number])   Date of Surgery: 02/27/2018  Pre-op Diagnosis: Left breast cancer with T3 tumor, >5 cm in greatest dimension  C50.912  Post-op Diagnosis: Malignant neoplasm of left female breast, unspecified estrogen receptor status, unspecified site of breast (CMS-HCC) [C50.912]  Procedure(s): MASTECTOMY, MODIFIED RADICAL, INCLUDE AXILLARY LYMPH NODES,: 19307 (CPT) Note: Revisions to procedures should be made in chart - see Procedures tab.  Performing Service: General Surgery Surgeon(s) and Role:    * Bertrum Fairy Sprung, MD - Primary  Anesthesia: General  Estimated Blood Loss: 50 mL  Complications: None  Specimens:  ID Type Source Tests Collected by Time Destination  1 : left breast, implant, and axillary node  **1stitch axillary tail 2stitch breast primary** Tissue Breast, Left SURGICAL PATHOLOGY EXAM Bertrum Fairy Sprung, MD 02/27/2018 1039   2 : remainder of left axillary content Tissue Axilla, Left SURGICAL PATHOLOGY EXAM Bertrum Fairy Sprung, MD 02/27/2018 1051   3 : left chest wall capsul Tissue Chest SURGICAL PATHOLOGY EXAM Bertrum Fairy Sprung, MD 02/27/2018 1051     Implants:  Implant Name Type Inv. Item Serial No. Manufacturer Lot No. [id number] No. Used  left breast implant/expander      Left 1    Operative Findings: 2 large left axillary lymph nodes were noted.  The breast primary appears to be the posterior portion of the breast did not obviously eroded through the capsule.  Procedure Description: [name] was taken to the operating room placed in supine position and she underwent a general tracheal anesthetic.  SCDs on both lower extremities are functioning well the anterior chest wall left breast axilla left arm were prepped and draped in the usual sterile manner.  After timeout being completed the incision was made in a transverse elliptical  fashion to include the nipple areolar complex.  Flaps were then raised superiorly to the level just below the clavicle.  Port-A-Cath which was being used for venous access was identified and preserved.  Suction was also carried medially to the sternum inferiorly the rectus abdominis fascia laterally to latissimus dorsi.  Breast was then dissected off the chest wall to include the pectoralis major muscle.  Previously placed placed implant was removed for histologic studies along with the breast tissue.  The anterior portion of the capsule was also removed and sent for histologic studies.  Dissection was carried out towards the left axilla where the thoracodorsal and long thoracic nerves were protected.  The remaining axillary contents contents were sent for histologic studies.  After making sure there was good hemostasis Blake #15 drains X 2 were placed to drain the axilla as well as the superior and inferior flaps.  Tissue to be irrigated with antibiotic solution and then the subcu was approximated with interrupted 4-0 Polysorb and the skin with skin clips.  Drains were secured to the chest wall with 3-0 nylon sutures x2.  Risks were applied and procedure was completed.  A postoperative care well which was prior to go to the postanesthesia area for care.  She will be admitted to a overnight bed.  She tolerated the operation well.  Dr. Siri will plan breast reconstruction in the next 2-3 weeks.

## 2018-03-03 ENCOUNTER — Telehealth: Payer: Self-pay | Admitting: Hematology and Oncology

## 2018-03-03 NOTE — Telephone Encounter (Signed)
Mailed patient calendar of upcoming may appointments per 4/12 sch message.

## 2018-03-08 ENCOUNTER — Telehealth: Payer: Self-pay

## 2018-03-08 NOTE — Telephone Encounter (Signed)
Returned pt call and informed her of her appt in May and her and Dr. Lindi Adie will discuss her treatment plan then.  Cyndia Bent RN

## 2018-03-28 IMAGING — CT CT CHEST W/ CM
2 of 5 series · 13 of 36 positions shown, 16 images · IV contrast (ISOVUE 300)
Comparison: 05/30/2017

CLINICAL DATA: New diagnosis of left breast cancer. Status post
right mastectomy and adjuvant chemotherapy for right breast cancer
in 7363. Ex-smoker.

EXAM:
CT CHEST, ABDOMEN, AND PELVIS WITH CONTRAST
TECHNIQUE: Multidetector CT imaging of the chest, abdomen and pelvis was
performed following the standard protocol during bolus
administration of intravenous contrast.
CONTRAST:  100mL [id number] IOPAMIDOL (UJP9S0-MFF) INJECTION 61%

[Series 3: cap with · axial · 0.71mm/px · z∈[+1138,+1674]mm · 10 of 131 slices shown, 13 images]
[im 12/131  mediastinal]
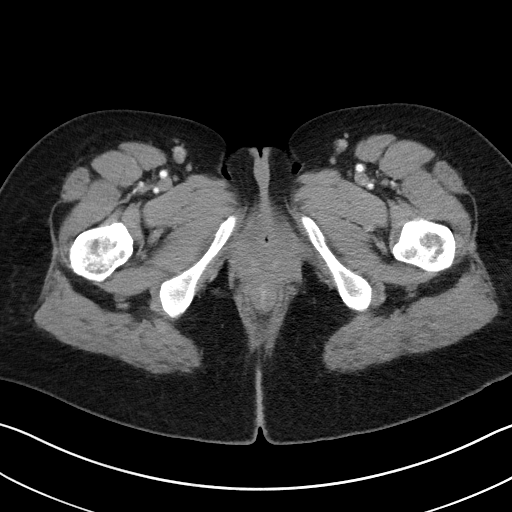
[im 12/131  lung]
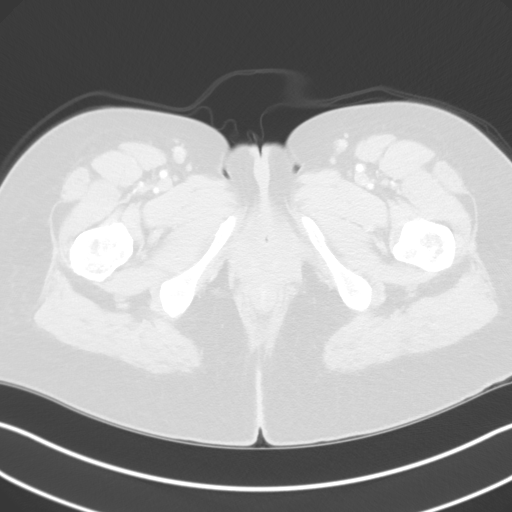
[im 24/131  lung]
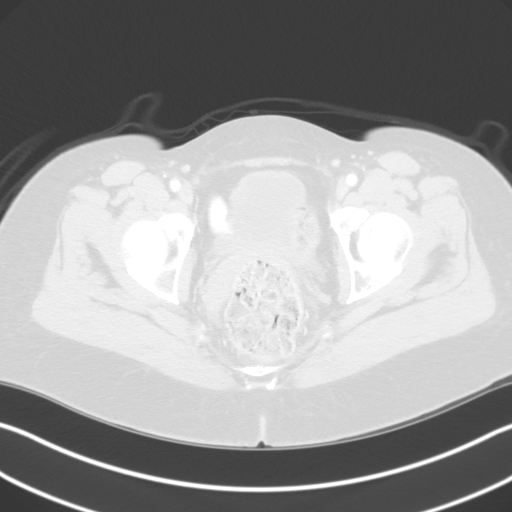
[im 36/131  lung]
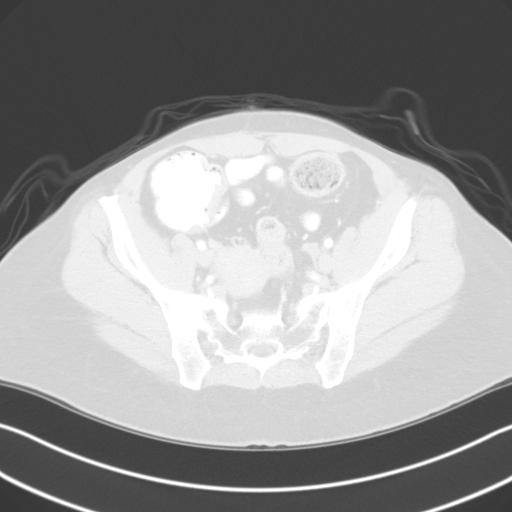
[im 48/131  lung]
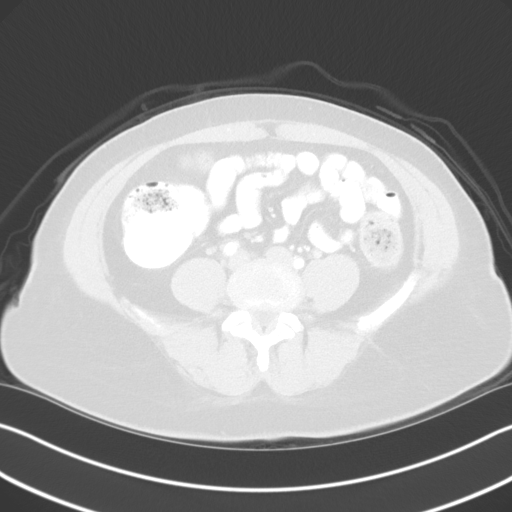
[im 60/131  mediastinal]
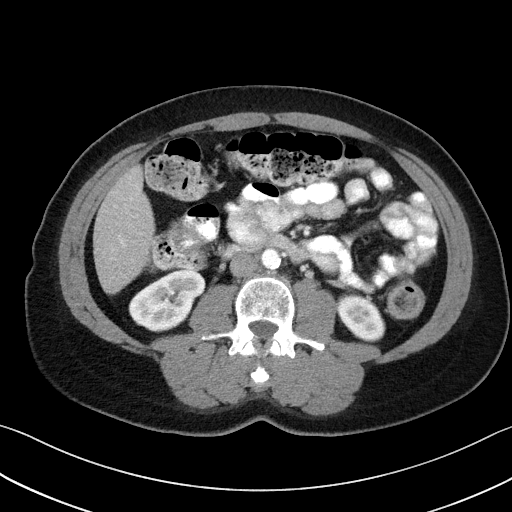
[im 60/131  lung]
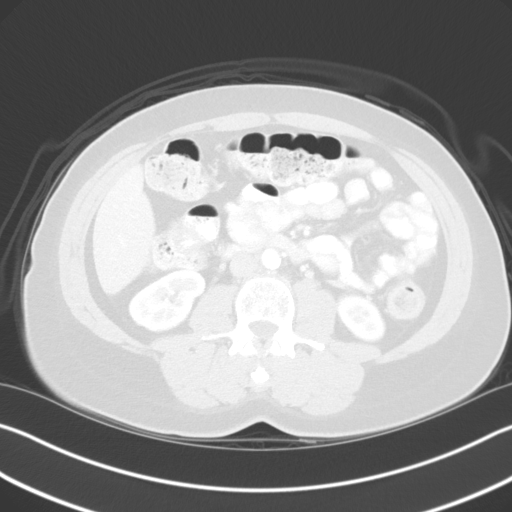
[im 71/131  lung]
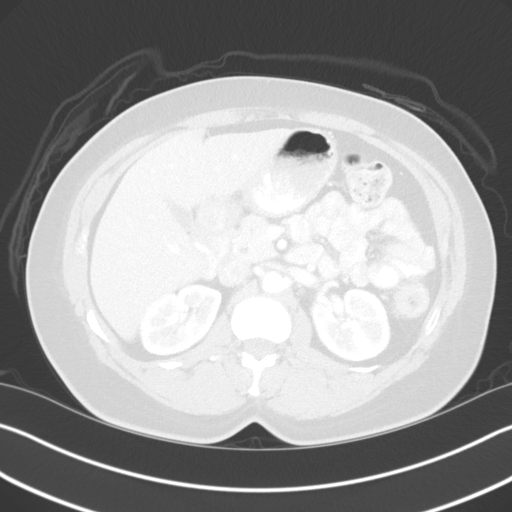
[im 83/131  lung]
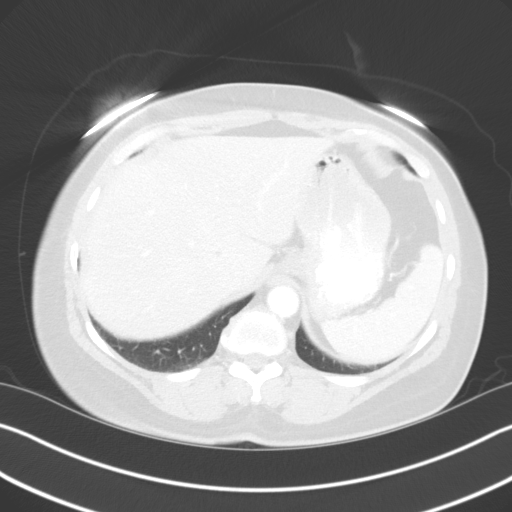
[im 95/131  lung]
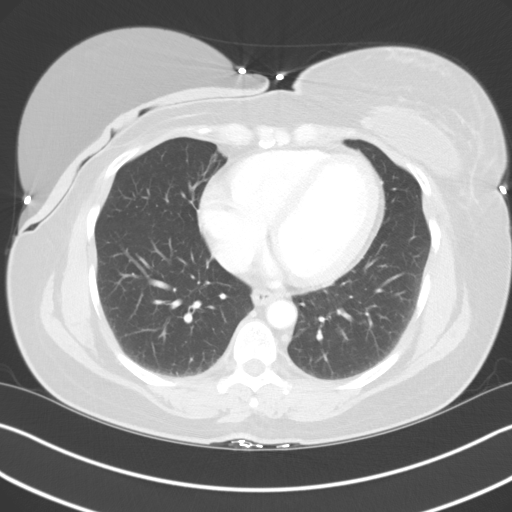
[im 107/131  mediastinal]
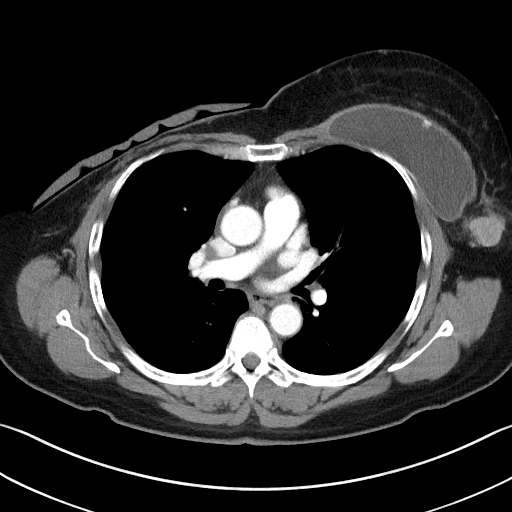
[im 107/131  lung]
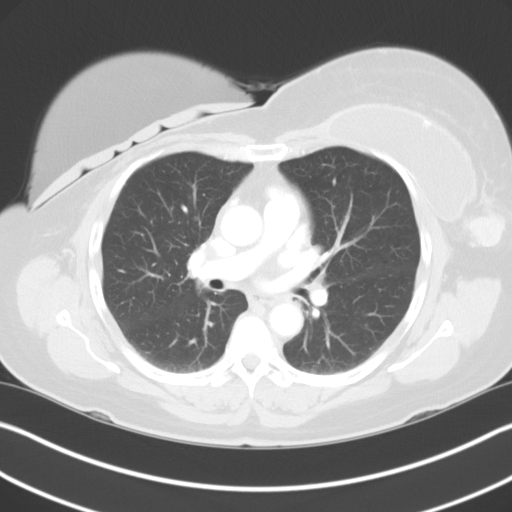
[im 119/131  lung]
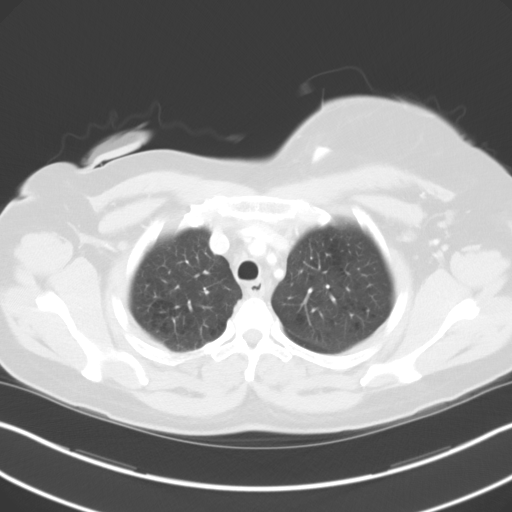

[Series 5: coronals · coronal · 0.95mm/px · 3 of 121 slices shown]
[im 25/121  lung]
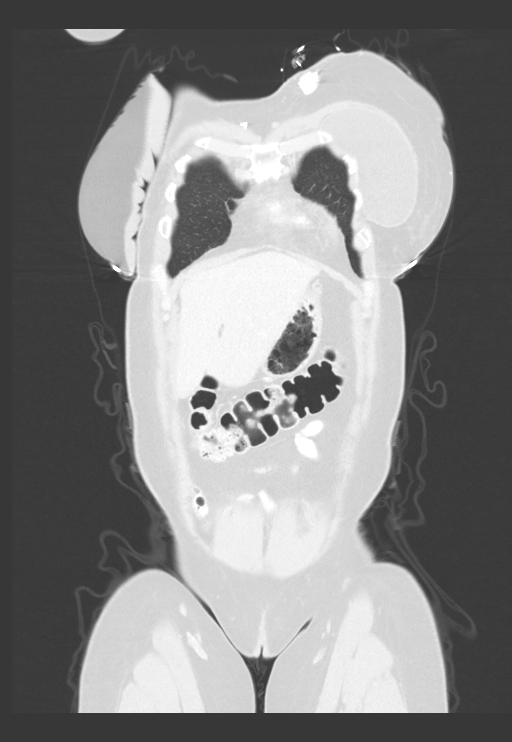
[im 49/121  lung]
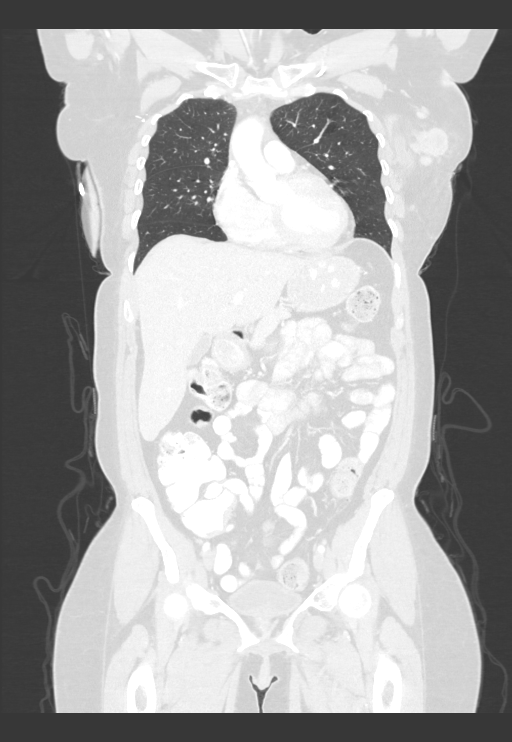
[im 73/121  lung]
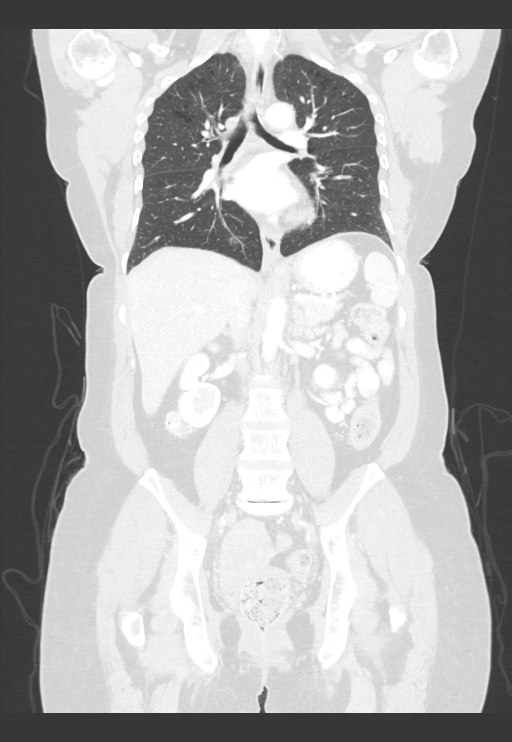

[13 of 36 positions shown; findings below may reference images not displayed]

FINDINGS: CT CHEST FINDINGS

Cardiovascular: Left subclavian porta catheter tip in the superior
vena cava. Normal sized heart. Minimal pericardial fluid with a
maximum thickness of 6 mm.

Mediastinum/Nodes: 6 mm right lobe thyroid nodule. The thyroid gland
is mildly enlarged and mildly heterogeneous the previously
demonstrated 2.8 x 1.5 cm left axillary lymph node is increased in
size, currently measuring 4.0 x 2.3 cm on image number 23 series 3.
There has been no significant change in a mildly asymmetrically
enlarged left retropectoral node, measuring 1.2 x 1.1 cm on image
number 14 of series 3. No enlarged mediastinal lymph nodes. The
previously demonstrated left supraclavicular hematoma is no longer
present.

Lungs/Pleura: Mild bilateral bullous changes, primarily in the upper
lobes. No lung nodules or pleural fluid.

Musculoskeletal: The previously demonstrated 2.4 x 1.6 cm mass in
the posterior aspect of the upper-outer quadrant of the left breast
currently measures 2.8 x 2.2 cm on image number 31 series 3. This is
abutting the pectoralis muscle with loss of the fat plane between
the mass and muscle. The retropectoral breast implant on the left is
intact. Stable postmastectomy changes on the right. Mild thoracic
spine degenerative changes. No evidence of bony metastatic disease.

CT ABDOMEN PELVIS FINDINGS

Hepatobiliary: No focal liver abnormality is seen. No gallstones,
gallbladder wall thickening, or biliary dilatation.

Pancreas: Unremarkable. No pancreatic ductal dilatation or
surrounding inflammatory changes.

Spleen: Normal in size without focal abnormality.

Adrenals/Urinary Tract: Adrenal glands are unremarkable. Kidneys are
normal, without renal calculi, focal lesion, or hydronephrosis.
Bladder is unremarkable.

Stomach/Bowel: Prominent stool throughout the majority of the colon,
including the rectum. Unremarkable stomach, small bowel and
appendix.

Vascular/Lymphatic: Atheromatous arterial calcifications without
aneurysm. No enlarged lymph nodes.

Reproductive: Posterior bulge of the uterus on the right, compatible
with a poorly defined fibroid. Unremarkable ovaries.

Other: No abdominal wall hernia or abnormality. No abdominopelvic
ascites.

Musculoskeletal: Lumbar spine degenerative changes, most pronounced
at the L5-S1 level. No evidence of bony metastatic disease.
IMPRESSION: 1. Interval increase in size of the known left breast cancer,
currently measuring 2.8 x 2.2 cm. This is abutting and possibly
invading the underlying pectoralis major muscle.
2. Progressive metastatic left axillary and retropectoral
adenopathy.
3. No evidence of metastatic disease in the abdomen or pelvis.
4. Mild changes of COPD with centrilobular emphysema.
5. Prominent stool throughout the colon.
6. Poorly defined right uterine fibroid.
7. 6 mm right lobe thyroid nodule. This is too small to
characterize, but most likely benign in the absence of known
clinical risk factors for thyroid carcinoma.
Emphysema ([id number]).

## 2018-03-30 ENCOUNTER — Encounter: Payer: Self-pay | Admitting: *Deleted

## 2018-03-30 ENCOUNTER — Inpatient Hospital Stay: Payer: BC Managed Care – PPO | Attending: Hematology and Oncology | Admitting: Hematology and Oncology

## 2018-03-30 ENCOUNTER — Telehealth: Payer: Self-pay | Admitting: Hematology and Oncology

## 2018-03-30 DIAGNOSIS — Z171 Estrogen receptor negative status [ER-]: Secondary | ICD-10-CM

## 2018-03-30 DIAGNOSIS — C50512 Malignant neoplasm of lower-outer quadrant of left female breast: Secondary | ICD-10-CM | POA: Diagnosis not present

## 2018-03-30 DIAGNOSIS — C773 Secondary and unspecified malignant neoplasm of axilla and upper limb lymph nodes: Secondary | ICD-10-CM | POA: Insufficient documentation

## 2018-03-30 NOTE — Assessment & Plan Note (Signed)
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  Treatment plan: 1. Neoadjuvant chemotherapy with Gemcitabine and Carboplatin x 6 cycles completed 01/02/18(chosen due to prior Adriamycin treatment and Taxol related severe neuropathy) 2. Followed by breast conserving surgery with targeted node dissection if possible 3. Followed by radiation  No evidence of distant metastasis on bone scan and CT chest, abdomen, and pelvis from 09/13/2017. -------------------------------------------------------------------------------

## 2018-03-30 NOTE — Progress Notes (Signed)
Patient Care Team: Patient, No Pcp Per as PCP - General (General Practice) Stark Klein, MD as Consulting Physician (General Surgery)  DIAGNOSIS:  Encounter Diagnosis  Name Primary?  . Malignant neoplasm of lower-outer quadrant of left breast of female, estrogen receptor negative ([place])     SUMMARY OF ONCOLOGIC HISTORY:   Malignant neoplasm of lower-outer quadrant of left breast of female, estrogen receptor negative ([place])   2003 Initial Biopsy    Right breast cancer diagnosed and treated at [facility] hormone receptor positive, 30 positive axillary lymph nodes modified radical mastectomy followed by chemotherapy, did not take radiation on antiestrogen therapy      08/29/2017 Initial Diagnosis    Left breast 5:00 position: 2.7 cm mass, left axillary LN 3.5 cm, adjacent lymph node 0.9 cm, third lymph node 1.1 cm; Biopsy IDC grade 3 with DCIS, lymph node biopsy positive for metastatic ductal carcinoma, ER 0%, PR 0%,HER-2 negative ratio 1.28, stage IIIB AJCC 8      09/09/2017 - 01/02/2018 Neo-Adjuvant Chemotherapy    Gemcitabine/Carboplatin given on day 1 and day 8 of a 21 day cycle      03/03/2018 Surgery    Left mastectomy: Poorly differentiated IDC 3.5 cm and 4.1 cm, margins negative, 2/16 lymph nodes positive, cancer invades perinodal tissue, 40% of the tumor tissue appears necrotic.       CHIEF COMPLIANT: Follow-up after left breast surgery  INTERVAL HISTORY: Suzanne May is a 57 year old with above-mentioned history of right breast cancer who developed left breast cancer triple negative disease who received neoadjuvant chemotherapy with carboplatin and gemcitabine.  Unfortunately she did not respond well to the treatment.  Follow-up scans still showed significant amount of residual disease.  She received multiple second opinions from surgeons and she finally underwent surgery at [facility] by Dr. Marlowe Sax.  She is recovering very well from the surgery.  She plans to undergo  breast reconstruction as soon as we can from the treatment plan.  REVIEW OF SYSTEMS:   Constitutional: Denies fevers, chills or abnormal weight loss Eyes: Denies blurriness of vision Ears, nose, mouth, throat, and face: Denies mucositis or sore throat Respiratory: Denies cough, dyspnea or wheezes Cardiovascular: Denies palpitation, chest discomfort Gastrointestinal:  Denies nausea, heartburn or change in bowel habits Skin: Denies abnormal skin rashes Lymphatics: Denies new lymphadenopathy or easy bruising Neurological:Denies numbness, tingling or new weaknesses Behavioral/Psych: Mood is stable, no new changes  Extremities: No lower extremity edema Breast: Left mastectomy All other systems were reviewed with the patient and are negative.  I have reviewed the past medical history, past surgical history, social history and family history with the patient and they are unchanged from previous note.  ALLERGIES:  is allergic to olive oil and penicillins.  MEDICATIONS:  Current Outpatient Medications  Medication Sig Dispense Refill  . lidocaine-prilocaine (EMLA) cream Apply to affected area once 30 g 3  . naproxen sodium (ANAPROX) 220 MG tablet Take 220-440 mg by mouth daily as needed (headache).    . ondansetron (ZOFRAN) 8 MG tablet Take 1 tablet (8 mg total) by mouth 2 (two) times daily as needed for refractory nausea / vomiting. Start on day 3 after chemotherapy. (Patient not taking: Reported on 09/16/2017) 30 tablet 1  . prochlorperazine (COMPAZINE) 10 MG tablet Take 1 tablet (10 mg total) by mouth every 6 (six) hours as needed (Nausea or vomiting). (Patient not taking: Reported on 09/16/2017) 30 tablet 1   No current facility-administered medications for this visit.     PHYSICAL EXAMINATION:  ECOG PERFORMANCE STATUS: 1 - Symptomatic but completely ambulatory  Vitals:   03/30/18 0937  BP: (!) 130/101  Pulse: 83  Resp: 17  Temp: 98.4 F (36.9 C)  SpO2: 100%   Filed Weights    03/30/18 0937  Weight: 164 lb 1.6 oz (74.4 kg)    GENERAL:alert, no distress and comfortable SKIN: skin color, texture, turgor are normal, no rashes or significant lesions EYES: normal, Conjunctiva are pink and non-injected, sclera clear OROPHARYNX:no exudate, no erythema and lips, buccal mucosa, and tongue normal  NECK: supple, thyroid normal size, non-tender, without nodularity LYMPH:  no palpable lymphadenopathy in the cervical, axillary or inguinal LUNGS: clear to auscultation and percussion with normal breathing effort HEART: regular rate & rhythm and no murmurs and no lower extremity edema ABDOMEN:abdomen soft, non-tender and normal bowel sounds MUSCULOSKELETAL:no cyanosis of digits and no clubbing  NEURO: alert & oriented x 3 with fluent speech, no focal motor/sensory deficits EXTREMITIES: No lower extremity edema  LABORATORY DATA:  I have reviewed the data as listed CMP Latest Ref Rng & Units 12/23/2017 12/09/2017 12/02/2017  Glucose 70 - 140 mg/dL 74 95 87  BUN 7 - 26 mg/dL _0 Creatinine 0.60 - 1.10 mg/dL 0.81 0.88 0.81  Sodium 136 - 145 mmol/L 140 139 139  Potassium 3.5 - 5.1 mmol/L 3.3(L) 3.1(L) 3.7  Chloride 98 - 109 mmol/L 106 103 105  CO2 22 - 29 mmol/L _1 Calcium 8.4 - 10.4 mg/dL 8.9 9.3 9.2  Total Protein 6.4 - 8.3 g/dL 7.0 7.6 7.3  Total Bilirubin 0.2 - 1.2 mg/dL 0.8 1.3(H) 0.6  Alkaline Phos 40 - 150 U/L 76 84 77  AST 5 - 34 U/L 43(H) 64(H) 35(H)  ALT 0 - 55 U/L 46 70(H) 35    Lab Results  Component Value Date   WBC 3.5 (L) 12/23/2017   HGB 8.9 (L) 12/23/2017   HCT 27.7 (L) 12/23/2017   MCV 95.5 12/23/2017   PLT 141 (L) 12/23/2017   NEUTROABS 1.4 (L) 12/23/2017    ASSESSMENT & PLAN:  Malignant neoplasm of lower-outer quadrant of left breast of female, estrogen receptor negative ([place]) 08/29/2017: Left breast 5:00 position: 2.7 cm mass, left axillary LN 3.5 cm, adjacent lymph node 0.9 cm, third lymph node 1.1 cm; Biopsy IDC grade 3 with DCIS,  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  02/27/2018: Left mastectomy: Poorly differentiated IDC 3.5 cm and 4.1 cm, margins negative, 2/16 lymph nodes positive, cancer invades perinodal tissue, 40% of the tumor tissue appears necrotic.  Treatment plan: 1. Neoadjuvant chemotherapy with Gemcitabine and Carboplatin x 6 cycles completed 01/02/18(chosen due to prior Adriamycin treatment and Taxol related severe neuropathy) 2. left mastectomy 02/27/2018 3.  Patient refuses radiation  No evidence of distant metastasis on bone scan and CT chest, abdomen, and pelvis from 09/13/2017. ------------------------------------------------------------------------------- I discussed with her about participating in clinical trial and we will see if she is eligible for the trial.  SWOG M7680: Patient's who have more than 1 cm residual disease after neoadjuvant chemotherapy for triple negative breast cancer are randomized to Pembrolizumab versus observation. Pembrolizumab is given every 3 weeks for 1 year  She may have to undergo breast reconstruction prior to immunotherapy. We will first determine if she is eligible for the immunotherapy trial. I will then call her surgeon Dr. Marlowe Sax and discuss the plan. If she is not eligible for the immunotherapy clinical trial then she  could get breast reconstruction done. She may not receive any further adjuvant therapy if she  does not qualify for the immunotherapy trial.   No orders of the defined types were placed in this encounter.  The patient has a good understanding of the overall plan. she agrees with it. she will call with any problems that may develop before the next visit here.   Harriette Ohara, MD 03/30/18

## 2018-03-30 NOTE — Telephone Encounter (Signed)
Per 5/9 no los 

## 2018-04-10 ENCOUNTER — Encounter: Payer: Self-pay | Admitting: *Deleted

## 2018-04-12 ENCOUNTER — Telehealth: Payer: Self-pay | Admitting: Hematology and Oncology

## 2018-04-12 NOTE — Telephone Encounter (Signed)
I tried to call the patient but the phone call does not have a voicemail setting I was unable to leave a message for her.  Because that she is not qualified for the clinical trial, she can proceed with breast reconstruction. I will have to discuss with her about any further additional systemic therapy including oral Xeloda versus IV CMF versus nothing further

## 2018-04-13 ENCOUNTER — Telehealth: Payer: Self-pay | Admitting: Hematology and Oncology

## 2018-04-13 NOTE — Telephone Encounter (Signed)
I called the patient's phone number once again. The phone number goes directly to message that says she does not have a voicemail set up

## 2018-05-19 ENCOUNTER — Telehealth: Payer: Self-pay

## 2018-05-19 NOTE — Telephone Encounter (Addendum)
Received a VM from [facility] at Dr Mayer Camel office requesting most recent labwork from our office because patient has an upcoming surgery there.  Called their office back today at [phone] option 1.  Jasmine said Raquel Sarna is in clinic today and will have to call our office back.  Jasmine said patient was planning to make an lab appt here, maybe for Monday.  I don't see any upcoming appts for patient and most recent labs are from Feb 2019. Left my contact info and awaiting call back from [facility]. (Their fax number is : [phone])

## 2018-05-22 ENCOUNTER — Telehealth: Payer: Self-pay

## 2018-05-22 ENCOUNTER — Inpatient Hospital Stay: Payer: BC Managed Care – PPO | Attending: Hematology and Oncology

## 2018-05-22 ENCOUNTER — Other Ambulatory Visit: Payer: Self-pay | Admitting: *Deleted

## 2018-05-22 ENCOUNTER — Other Ambulatory Visit: Payer: Self-pay

## 2018-05-22 ENCOUNTER — Inpatient Hospital Stay: Payer: BC Managed Care – PPO

## 2018-05-22 DIAGNOSIS — Z171 Estrogen receptor negative status [ER-]: Secondary | ICD-10-CM

## 2018-05-22 DIAGNOSIS — C50512 Malignant neoplasm of lower-outer quadrant of left female breast: Secondary | ICD-10-CM | POA: Insufficient documentation

## 2018-05-22 DIAGNOSIS — Z95828 Presence of other vascular implants and grafts: Secondary | ICD-10-CM

## 2018-05-22 DIAGNOSIS — Z452 Encounter for adjustment and management of vascular access device: Secondary | ICD-10-CM | POA: Diagnosis not present

## 2018-05-22 LAB — CBC WITH DIFFERENTIAL (CANCER CENTER ONLY)
Basophils Absolute: 0 10*3/uL (ref 0.0–0.1)
Basophils Relative: 1 %
Eosinophils Absolute: 0.2 10*3/uL (ref 0.0–0.5)
Eosinophils Relative: 3 %
HEMATOCRIT: 36.6 % (ref 34.8–46.6)
HEMOGLOBIN: 11.8 g/dL (ref 11.6–15.9)
LYMPHS ABS: 2.8 10*3/uL (ref 0.9–3.3)
LYMPHS PCT: 37 %
MCH: 28 pg (ref 25.1–34.0)
MCHC: 32.3 g/dL (ref 31.5–36.0)
MCV: 86.5 fL (ref 79.5–101.0)
MONO ABS: 0.4 10*3/uL (ref 0.1–0.9)
MONOS PCT: 6 %
NEUTROS ABS: 3.9 10*3/uL (ref 1.5–6.5)
Neutrophils Relative %: 53 %
Platelet Count: 179 10*3/uL (ref 145–400)
RBC: 4.23 MIL/uL (ref 3.70–5.45)
RDW: 16.6 % — ABNORMAL HIGH (ref 11.2–14.5)
WBC Count: 7.4 10*3/uL (ref 3.9–10.3)

## 2018-05-22 LAB — CMP (CANCER CENTER ONLY)
ALBUMIN: 4.3 g/dL (ref 3.5–5.0)
ALK PHOS: 78 U/L (ref 38–126)
ALT: 11 U/L (ref 0–44)
ANION GAP: 6 (ref 5–15)
AST: 16 U/L (ref 15–41)
BUN: 7 mg/dL (ref 6–20)
CHLORIDE: 105 mmol/L (ref 98–111)
CO2: 27 mmol/L (ref 22–32)
Calcium: 9.9 mg/dL (ref 8.9–10.3)
Creatinine: 0.76 mg/dL (ref 0.44–1.00)
GFR, Est AFR Am: >60 mL/min (ref >60)
GFR, Estimated: >60 mL/min (ref >60)
GLUCOSE: 84 mg/dL (ref 70–99)
POTASSIUM: 3.4 mmol/L — AB (ref 3.5–5.1)
SODIUM: 138 mmol/L (ref 135–145)
Total Bilirubin: 0.6 mg/dL (ref 0.3–1.2)
Total Protein: 7.2 g/dL (ref 6.5–8.1)

## 2018-05-22 MED ORDER — SODIUM CHLORIDE 0.9% FLUSH
10.0000 mL | Freq: Once | INTRAVENOUS | Status: AC
  Administered 2018-05-22: 10 mL
  Filled 2018-05-22: qty 10

## 2018-05-22 MED ORDER — HEPARIN SOD (PORK) LOCK FLUSH 100 UNIT/ML IV SOLN
500.0000 [IU] | Freq: Once | INTRAVENOUS | Status: AC
  Administered 2018-05-22: 500 [IU]
  Filled 2018-05-22: qty 5

## 2018-05-22 NOTE — Telephone Encounter (Signed)
Was notified that pt is in the lobby to have lab work done here pertaining to my last telephone note.  Per Dr Lindi Adie, received orders for labs requested by [facility] for pt's upcoming surgery on July 10th. Agnes Lawrence RN at [facility] to verify orders (CBC, CMET, Nicotine serum w/ cotinine).  Made lab add-on appt and arrived pt. Pt signed medical release form so we can fax [facility] the lab results from today, sent release to HIM to be scanned in to our system.  No other needs per pt at this time.

## 2018-05-23 ENCOUNTER — Telehealth: Payer: Self-pay

## 2018-05-23 NOTE — Telephone Encounter (Signed)
See my recent telephone notes in regards to patient had labs here for her upcoming surgery at [facility] on July 10th.  They need her results and pt has signed a release yesterday here when she got her blood work done. Agnes Lawrence at [phone] [facility] Lawman and faxed pt's CBC and CMET to their fax number is : [phone].  I let Raquel Sarna know that we would keep a look-out for the nicotine results, as they are still in process.

## 2018-05-28 LAB — NICOTINE/COTININE METABOLITES
Cotinine: 614 ng/mL
NICOTINE: 20 ng/mL

## 2018-05-29 ENCOUNTER — Telehealth: Payer: Self-pay

## 2018-05-29 NOTE — Telephone Encounter (Signed)
Lab results (Nicotine) faxed to surgeons office at [phone].  Confirmation received.

## 2018-05-30 ENCOUNTER — Telehealth: Payer: Self-pay

## 2018-05-30 NOTE — Telephone Encounter (Signed)
Dr. Vista Lawman called regarding patient's upcoming surgery.  Patient requesting to have port removed as well.  This nurse confirmed with Dr. Lindi Adie that it is okay for removal of port.  Dr. Vista Lawman aware.

## 2018-12-02 ENCOUNTER — Other Ambulatory Visit: Payer: Self-pay

## 2018-12-02 ENCOUNTER — Emergency Department (HOSPITAL_COMMUNITY)
Admission: EM | Admit: 2018-12-02 | Discharge: 2018-12-02 | Disposition: A | Discharge status: Home / Self Care | Payer: BC Managed Care – PPO | Attending: Emergency Medicine | Admitting: Emergency Medicine

## 2018-12-02 ENCOUNTER — Encounter (HOSPITAL_COMMUNITY): Payer: Self-pay

## 2018-12-02 DIAGNOSIS — Z79899 Other long term (current) drug therapy: Secondary | ICD-10-CM | POA: Insufficient documentation

## 2018-12-02 DIAGNOSIS — X58XXXA Exposure to other specified factors, initial encounter: Secondary | ICD-10-CM | POA: Insufficient documentation

## 2018-12-02 DIAGNOSIS — Y939 Activity, unspecified: Secondary | ICD-10-CM | POA: Diagnosis not present

## 2018-12-02 DIAGNOSIS — C50512 Malignant neoplasm of lower-outer quadrant of left female breast: Secondary | ICD-10-CM | POA: Insufficient documentation

## 2018-12-02 DIAGNOSIS — S21002A Unspecified open wound of left breast, initial encounter: Secondary | ICD-10-CM | POA: Insufficient documentation

## 2018-12-02 DIAGNOSIS — Y929 Unspecified place or not applicable: Secondary | ICD-10-CM | POA: Insufficient documentation

## 2018-12-02 DIAGNOSIS — Y999 Unspecified external cause status: Secondary | ICD-10-CM | POA: Insufficient documentation

## 2018-12-02 DIAGNOSIS — Z87891 Personal history of nicotine dependence: Secondary | ICD-10-CM | POA: Insufficient documentation

## 2018-12-02 MED ORDER — CLINDAMYCIN HCL 300 MG PO CAPS: 300.0000 mg | ORAL_CAPSULE | Freq: Three times a day (TID) | ORAL | 0 refills | Status: DC
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
## 2018-12-02 NOTE — ED Triage Notes (Signed)
Pt here for abscess on breast.  Pt had mastectomy last year and has been doing expanders for breast.  Starting after christmas, site has created an abscess.  Has been on antibiotics and it is not healing.

## 2018-12-02 NOTE — Discharge Instructions (Addendum)
Call your doctor for a follow up appointment. Change the dressing and clean the wound twice a day.

## 2018-12-19 ENCOUNTER — Telehealth: Payer: Self-pay

## 2018-12-19 NOTE — Progress Notes (Signed)
Patient Care Team: Suzanne Jordan, MD as PCP - General (Family Medicine) Suzanne Klein, MD as Consulting Physician (General Surgery)  DIAGNOSIS:    ICD-10-CM   1. Malignant neoplasm of lower-outer quadrant of left breast of female, estrogen receptor negative ([name]) C50.512    Z17.1     SUMMARY OF ONCOLOGIC HISTORY:   Malignant neoplasm of lower-outer quadrant of left breast of female, estrogen receptor negative ([name])   2003 Initial Biopsy    Right breast cancer diagnosed and treated at [facility] hormone receptor positive, 30 positive axillary lymph nodes modified radical mastectomy followed by chemotherapy, did not take radiation on antiestrogen therapy    08/29/2017 Initial Diagnosis    Left breast 5:00 position: 2.7 cm mass, left axillary LN 3.5 cm, adjacent lymph node 0.9 cm, third lymph node 1.1 cm; Biopsy IDC grade 3 with DCIS, lymph node biopsy positive for metastatic ductal carcinoma, ER 0%, PR 0%,HER-2 negative ratio 1.28, stage IIIB AJCC 8    09/09/2017 - 01/02/2018 Neo-Adjuvant Chemotherapy    Gemcitabine/Carboplatin given on day 1 and day 8 of a 21 day cycle    03/03/2018 Surgery    Left mastectomy: Poorly differentiated IDC 3.5 cm and 4.1 cm, margins negative, 2/16 lymph nodes positive, cancer invades perinodal tissue, 40% of the tumor tissue appears necrotic.     CHIEF COMPLIANT: Surveillance of breast cancer and recent left breast abcess  INTERVAL HISTORY: Suzanne May is a 58 y.o. with above-mentioned history of right breast cancer who developed triple negative left breast cancer and received neoadjuvant chemotherapy and a left mastectomy. I last saw her 7 months ago. On 12/02/18 she presented to the ED for an abscess of the left breast at the mastectomy site where she has expanders.   She presents to the clinic alone today. She had a reconstruction surgery in 05/2018 and had expanders inserted about two months ago. She does regular breast exams and she saw a scab  on her right breast shortly after. She saw her surgeon and was prescribed a sulfate cream and had an allergic reaction so she was instead given Medihoney that she applied every day which removed skin and created a hole in her breast. She presented to the ED after noticing a slight odor and worsening of the hole and had an infection and abscess of her left breast and was given clindamycin for an infection. Clindamycin caused mild diarrhea. The infection got worse and she had to have her implant removed and after surgery she had infections and another hole after having stitches removed prematurely by her surgeon. She is very upset with her surgeon and the care she received from him, noting he is dismissive and rough with her and is thinking about filing a report and pursuing legal action. She reports she possibly had a low-grade fever recently and notes her breast is still infected.   REVIEW OF SYSTEMS:   Constitutional: Denies fevers, chills or abnormal weight loss Eyes: Denies blurriness of vision Ears, nose, mouth, throat, and face: Denies mucositis or sore throat Respiratory: Denies cough, dyspnea or wheezes Cardiovascular: Denies palpitation, chest discomfort Gastrointestinal:  Denies nausea, heartburn or change in bowel habits Skin: Denies abnormal skin rashes Lymphatics: Denies new lymphadenopathy or easy bruising Neurological: Denies numbness, tingling or new weaknesses Behavioral/Psych: Mood is stable, no new changes  Extremities: No lower extremity edema Breast: denies any lumps or nodules in either breasts (+) pain, infection in left chest area at site of mastectomy All other systems were reviewed  with the patient and are negative.  I have reviewed the past medical history, past surgical history, social history and family history with the patient and they are unchanged from previous note.  ALLERGIES:  is allergic to penicillins; olive oil; and sulfa antibiotics.  MEDICATIONS:  Current  Outpatient Medications  Medication Sig Dispense Refill  . clindamycin (CLEOCIN) 300 MG capsule Take 1 capsule (300 mg total) by mouth 3 (three) times daily. 21 capsule 0  . Multiple Vitamin (MULTIVITAMIN WITH MINERALS) TABS tablet Take 1 tablet by mouth daily.    . Wound Dressings (MEDIHONEY WOUND/BURN DRESSING) PSTE Apply 1 application topically 2 (two) times daily.     No current facility-administered medications for this visit.     PHYSICAL EXAMINATION: ECOG PERFORMANCE STATUS: 1 - Symptomatic but completely ambulatory  Vitals:   12/20/18 1105  BP: (!) 154/93  Pulse: 90  Resp: 18  Temp: 98.3 F (36.8 C)  SpO2: 100%   Filed Weights   12/20/18 1105  Weight: 170 lb 1.6 oz (77.2 kg)    GENERAL: alert, no distress and comfortable SKIN: skin color, texture, turgor are normal, no rashes or significant lesions EYES: normal, Conjunctiva are pink and non-injected, sclera clear OROPHARYNX: no exudate, no erythema and lips, buccal mucosa, and tongue normal  NECK: supple, thyroid normal size, non-tender, without nodularity LYMPH: no palpable lymphadenopathy in the cervical, axillary or inguinal LUNGS: clear to auscultation and percussion with normal breathing effort HEART: regular rate & rhythm and no murmurs and no lower extremity edema ABDOMEN: abdomen soft, non-tender and normal bowel sounds MUSCULOSKELETAL: no cyanosis of digits and no clubbing  NEURO: alert & oriented x 3 with fluent speech, no focal motor/sensory deficits EXTREMITIES: No lower extremity edema   LABORATORY DATA:  I have reviewed the data as listed CMP Latest Ref Rng & Units 05/22/2018 12/23/2017 12/09/2017  Glucose 70 - 99 mg/dL 84 74 95  BUN 6 - 20 mg/dL '7 8 8  ' Creatinine 0.44 - 1.00 mg/dL 0.76 0.81 0.88  Sodium 135 - 145 mmol/L 138 140 139  Potassium 3.5 - 5.1 mmol/L 3.4(L) 3.3(L) 3.1(L)  Chloride 98 - 111 mmol/L 105 106 103  CO2 22 - 32 mmol/L '27 25 24  ' Calcium 8.9 - 10.3 mg/dL 9.9 8.9 9.3  Total Protein  6.5 - 8.1 g/dL 7.2 7.0 7.6  Total Bilirubin 0.3 - 1.2 mg/dL 0.6 0.8 1.3(H)  Alkaline Phos 38 - 126 U/L 78 76 84  AST 15 - 41 U/L 16 43(H) 64(H)  ALT 0 - 44 U/L 11 46 70(H)    Lab Results  Component Value Date   WBC 7.4 05/22/2018   HGB 11.8 05/22/2018   HCT 36.6 05/22/2018   MCV 86.5 05/22/2018   PLT 179 05/22/2018   NEUTROABS 3.9 05/22/2018    ASSESSMENT & PLAN:  Malignant neoplasm of lower-outer quadrant of left breast of female, estrogen receptor negative ([place]) 08/29/2017: Left breast 5:00 position: 2.7 cm mass, left axillary LN 3.5 cm, adjacent lymph node 0.9 cm, third lymph node 1.1 cm; Biopsy IDC grade 3 with DCIS, 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  02/27/2018: Left mastectomy: Poorly differentiated IDC 3.5 cm and 4.1 cm, margins negative, 2/16 lymph nodes positive, cancer invades perinodal tissue, 40% of the tumor tissue appears necrotic.  Treatment plan: 1. Neoadjuvant chemotherapy with Gemcitabine and Carboplatinx 6 cycles completed 01/02/18(chosen due to prior Adriamycin treatment and Taxol related severe neuropathy) 2. left mastectomy 02/27/2018  3.  Patient refused radiation  No evidence of distant metastasis on bone scan and CT chest, abdomen, and pelvis from 09/13/2017. We offered her participation in SWOG S14 18 clinical trial but she did not return her phone calls. I called her several times to discuss Xeloda adjuvant treatment but she did not return our calls. ------------------------------------------------------------------------------- Breast reconstruction led to multiple complications including what appears to be skin necrosis that required removal of the expander.  Currently she has sutures which are draining serosanguineous/purulent material that is slightly foul-smelling.  We prescribed her clindamycin today along with probiotics to help with this.  Patient may be looking for legal action  against the plastic surgeon. She will inform us if she wants to see another plastic surgeon in the future.  We can see her back in 6 months for follow-ups.    No orders of the defined types were placed in this encounter.  The patient has a good understanding of the overall plan. she agrees with it. she will call with any problems that may develop before the next visit here.  Nicholas Lose, MD 12/20/2018  Julious Oka Dorshimer am acting as scribe for Dr. Nicholas Lose.  I have reviewed the above documentation for accuracy and completeness, and I agree with the above.

## 2018-12-19 NOTE — Telephone Encounter (Signed)
Pt called and would like to see Dr.Gudena for an appt as soon as possible to discuss her recent surgical experience. She is thinking about seeing another surgeon at this point, but would like to speak with Dr.Gudena regarding the details of her experience. Pt has an upcoming appt and confirmed time/date.

## 2018-12-20 ENCOUNTER — Inpatient Hospital Stay: Payer: BC Managed Care – PPO | Attending: Hematology and Oncology | Admitting: Hematology and Oncology

## 2018-12-20 DIAGNOSIS — Z9221 Personal history of antineoplastic chemotherapy: Secondary | ICD-10-CM | POA: Diagnosis not present

## 2018-12-20 DIAGNOSIS — Z171 Estrogen receptor negative status [ER-]: Secondary | ICD-10-CM | POA: Diagnosis not present

## 2018-12-20 DIAGNOSIS — C773 Secondary and unspecified malignant neoplasm of axilla and upper limb lymph nodes: Secondary | ICD-10-CM

## 2018-12-20 DIAGNOSIS — C50512 Malignant neoplasm of lower-outer quadrant of left female breast: Secondary | ICD-10-CM

## 2018-12-20 DIAGNOSIS — Z9012 Acquired absence of left breast and nipple: Secondary | ICD-10-CM | POA: Diagnosis not present

## 2018-12-20 MED ORDER — CLINDAMYCIN HCL 300 MG PO CAPS: 300.0000 mg | ORAL_CAPSULE | Freq: Three times a day (TID) | ORAL | 0 refills | Status: DC

## 2018-12-20 NOTE — Assessment & Plan Note (Signed)
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  02/27/2018: Left mastectomy: Poorly differentiated IDC 3.5 cm and 4.1 cm, margins negative, 2/16 lymph nodes positive, cancer invades perinodal tissue, 40% of the tumor tissue appears necrotic.  Treatment plan: 1. Neoadjuvant chemotherapy with Gemcitabine and Carboplatinx 6 cycles completed 01/02/18(chosen due to prior Adriamycin treatment and Taxol related severe neuropathy) 2. left mastectomy 02/27/2018 3.  Patient refused radiation  No evidence of distant metastasis on bone scan and CT chest, abdomen, and pelvis from 09/13/2017. We offered her participation in SWOG S14 18 clinical trial but she did not return her phone calls. I called her several times to discuss Xeloda adjuvant treatment but she did not return our calls. ------------------------------------------------------------------------------- Breast reconstruction

## 2018-12-21 ENCOUNTER — Telehealth: Payer: Self-pay | Admitting: Hematology and Oncology

## 2018-12-21 NOTE — Telephone Encounter (Signed)
No los °

## 2019-03-10 ENCOUNTER — Encounter: Payer: Self-pay | Admitting: Hematology and Oncology

## 2019-03-10 ENCOUNTER — Other Ambulatory Visit: Payer: Self-pay | Admitting: Nurse Practitioner

## 2020-06-25 NOTE — Progress Notes (Signed)
Patient Care Team: Jonathon Jordan, MD as PCP - General (Family Medicine) Stark Klein, MD as Consulting Physician (General Surgery)  DIAGNOSIS:    ICD-10-CM   1. Malignant neoplasm of lower-outer quadrant of left breast of female, estrogen receptor negative ([place])  C50.512    Z17.1     SUMMARY OF ONCOLOGIC HISTORY: Oncology History  Malignant neoplasm of lower-outer quadrant of left breast of female, estrogen receptor negative ([place])  2003 Initial Biopsy   Right breast cancer diagnosed and treated at [facility] hormone receptor positive, 30 positive axillary lymph nodes modified radical mastectomy followed by chemotherapy, did not take radiation on antiestrogen therapy   08/29/2017 Initial Diagnosis   Left breast 5:00 position: 2.7 cm mass, left axillary LN 3.5 cm, adjacent lymph node 0.9 cm, third lymph node 1.1 cm; Biopsy IDC grade 3 with DCIS, lymph node biopsy positive for metastatic ductal carcinoma, ER 0%, PR 0%,HER-2 negative ratio 1.28, stage IIIB AJCC 8   09/09/2017 - 01/02/2018 Neo-Adjuvant Chemotherapy   Gemcitabine/Carboplatin given on day 1 and day 8 of a 21 day cycle   03/03/2018 Surgery   Left mastectomy: Poorly differentiated IDC 3.5 cm and 4.1 cm, margins negative, 2/16 lymph nodes positive, cancer invades perinodal tissue, 40% of the tumor tissue appears necrotic.     CHIEF COMPLIANT: Surveillance of breast cancer  INTERVAL HISTORY: Suzanne May is a 59 y.o. with above-mentioned history of right breast cancer who developed triple negative left breast cancer and received neoadjuvant chemotherapy and a left mastectomy. She presents to the clinic today for follow-up.  She reports that her health has been fairly decent.  She has not had a breast reconstruction.  She has recovered from all the previous infections.  Her major complaint today is neck and shoulder pain especially on the left side. Her husband reports that she has had a boil underneath her arm as well as  acid reflux indigestion.  ALLERGIES:  is allergic to penicillins, olive oil, and sulfa antibiotics.  MEDICATIONS: Does not take any medications. No current outpatient medications on file.   No current facility-administered medications for this visit.    PHYSICAL EXAMINATION: ECOG PERFORMANCE STATUS: 1 - Symptomatic but completely ambulatory  Vitals:   06/26/20 1010  BP: (!) 112/93  Pulse: 81  Resp: 18  Temp: 98.5 F (36.9 C)  SpO2: 100%   Filed Weights   06/26/20 1010  Weight: 174 lb 12.8 oz (79.3 kg)    BREAST: No palpable masses or nodules in either right or left breasts. No palpable axillary supraclavicular or infraclavicular adenopathy no breast tenderness or nipple discharge. (exam performed in the presence of a chaperone)  LABORATORY DATA:  I have reviewed the data as listed CMP Latest Ref Rng & Units 05/22/2018 12/23/2017 12/09/2017  Glucose 70 - 99 mg/dL 84 74 95  BUN 6 - 20 mg/dL _0 Creatinine 0.44 - 1.00 mg/dL 0.76 0.81 0.88  Sodium 135 - 145 mmol/L 138 140 139  Potassium 3.5 - 5.1 mmol/L 3.4(L) 3.3(L) 3.1(L)  Chloride 98 - 111 mmol/L 105 106 103  CO2 22 - 32 mmol/L _1 Calcium 8.9 - 10.3 mg/dL 9.9 8.9 9.3  Total Protein 6.5 - 8.1 g/dL 7.2 7.0 7.6  Total Bilirubin 0.3 - 1.2 mg/dL 0.6 0.8 1.3(H)  Alkaline Phos 38 - 126 U/L 78 76 84  AST 15 - 41 U/L 16 43(H) 64(H)  ALT 0 - 44 U/L 11 46 70(H)    Lab Results  Component Value Date   WBC 7.4 05/22/2018   HGB 11.8 05/22/2018   HCT 36.6 05/22/2018   MCV 86.5 05/22/2018   PLT 179 05/22/2018   NEUTROABS 3.9 05/22/2018    ASSESSMENT & PLAN:  Malignant neoplasm of lower-outer quadrant of left breast of female, estrogen receptor negative ([place]) 08/29/2017: Left breast 5:00 position: 2.7 cm mass, left axillary LN 3.5 cm, adjacent lymph node 0.9 cm, third lymph node 1.1 cm; Biopsy IDC grade 3 with DCIS, lymph node biopsy positive for metastatic ductal carcinoma, ER 0%, PR 0%,HER-2 negative ratio  1.28.  02/27/2018:Left mastectomy: Poorly differentiated IDC 3.5 cm and 4.1 cm, margins negative, 2/16 lymph nodes positive, cancer invades perinodal tissue, 40% of the tumor tissue appears necrotic.  Treatment plan: 1. Neoadjuvant chemotherapy with Gemcitabine and Carboplatinx 6 cycles completed 01/02/18(chosen due to prior Adriamycin treatment and Taxol related severe neuropathy) 2.left mastectomy 02/27/2018 3.Patient refused radiation  No evidence of distant metastasis on bone scan and CT chest, abdomen, and pelvis from 09/13/2017. We offered her participation in SWOG S14 18 clinical trial but she did not return her phone calls. I called her several times to discuss Xeloda adjuvant treatment but she did not return our calls. ------------------------------------------------------------------------------- Breast reconstruction led to multiple complications including what appears to be skin necrosis that required removal of the expander.   Neck and shoulder pain: I suspect this is musculoskeletal from her recent activities. If the symptoms do not get better in a couple of weeks then we will need to scan her.  She is not interested in taking COVID-19 vaccine. We can see her back in 6 months for follow-ups.    No orders of the defined types were placed in this encounter.  The patient has a good understanding of the overall plan. she agrees with it. she will call with any problems that may develop before the next visit here.  Total time spent: 20 mins including face to face time and time spent for planning, charting and coordination of care  Nicholas Lose, MD 06/26/2020  I, Cloyde Reams Dorshimer, am acting as scribe for Dr. Nicholas Lose.  I have reviewed the above documentation for accuracy and completeness, and I agree with the above.

## 2020-06-26 ENCOUNTER — Inpatient Hospital Stay: Payer: BC Managed Care – PPO | Attending: Hematology and Oncology | Admitting: Hematology and Oncology

## 2020-06-26 ENCOUNTER — Other Ambulatory Visit: Payer: Self-pay

## 2020-06-26 DIAGNOSIS — Z171 Estrogen receptor negative status [ER-]: Secondary | ICD-10-CM

## 2020-06-26 DIAGNOSIS — Z9012 Acquired absence of left breast and nipple: Secondary | ICD-10-CM | POA: Insufficient documentation

## 2020-06-26 DIAGNOSIS — C50512 Malignant neoplasm of lower-outer quadrant of left female breast: Secondary | ICD-10-CM

## 2020-06-26 DIAGNOSIS — M542 Cervicalgia: Secondary | ICD-10-CM | POA: Diagnosis not present

## 2020-06-26 DIAGNOSIS — M25512 Pain in left shoulder: Secondary | ICD-10-CM | POA: Insufficient documentation

## 2020-06-26 DIAGNOSIS — Z9221 Personal history of antineoplastic chemotherapy: Secondary | ICD-10-CM | POA: Diagnosis not present

## 2020-06-26 NOTE — Assessment & Plan Note (Signed)
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  02/27/2018:Left mastectomy: Poorly differentiated IDC 3.5 cm and 4.1 cm, margins negative, 2/16 lymph nodes positive, cancer invades perinodal tissue, 40% of the tumor tissue appears necrotic.  Treatment plan: 1. Neoadjuvant chemotherapy with Gemcitabine and Carboplatinx 6 cycles completed 01/02/18(chosen due to prior Adriamycin treatment and Taxol related severe neuropathy) 2.left mastectomy 02/27/2018 3.Patient refused radiation  No evidence of distant metastasis on bone scan and CT chest, abdomen, and pelvis from 09/13/2017. We offered her participation in SWOG S14 18 clinical trial but she did not return her phone calls. I called her several times to discuss Xeloda adjuvant treatment but she did not return our calls. ------------------------------------------------------------------------------- Breast reconstruction led to multiple complications including what appears to be skin necrosis that required removal of the expander.  Currently she has sutures which are draining serosanguineous/purulent material that is slightly foul-smelling.  We prescribed her clindamycin today along with probiotics to help with this.  Patient may be looking for legal action against the plastic surgeon. She will inform us if she wants to see another plastic surgeon in the future.  We can see her back in 6 months for follow-ups.

## 2020-06-27 ENCOUNTER — Telehealth: Payer: Self-pay | Admitting: Hematology and Oncology

## 2020-06-27 NOTE — Telephone Encounter (Signed)
Scheduled appts per 8/5 los. Pt confirmed appt date and time.

## 2020-11-12 ENCOUNTER — Telehealth: Payer: Self-pay | Admitting: Hematology and Oncology

## 2020-11-12 NOTE — Telephone Encounter (Signed)
Left message to reschedule appointment due to provider PAL. 

## 2020-12-23 NOTE — Assessment & Plan Note (Signed)
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  02/27/2018:Left mastectomy: Poorly differentiated IDC 3.5 cm and 4.1 cm, margins negative, 2/16 lymph nodes positive, cancer invades perinodal tissue, 40% of the tumor tissue appears necrotic.  Treatment summary: 1. Neoadjuvant chemotherapy with Gemcitabine and Carboplatinx 6 cycles completed 01/02/18(chosen due to prior Adriamycin treatment and Taxol related severe neuropathy) 2.left mastectomy 02/27/2018 3.Patient refusedradiation 4. Breast reconstructionled to multiple complications including what appears to be skin necrosis that required removal of the expander.  ------------------------------------------------------------------------------------------------------------------------------ Breast cancer surveillance: 1.  Breast exam: Benign

## 2020-12-28 NOTE — Progress Notes (Signed)
Patient Care Team: Mila Palmer, MD as PCP - General (Family Medicine) Almond Lint, MD as Consulting Physician (General Surgery)  DIAGNOSIS:    ICD-10-CM   1. Malignant neoplasm of lower-outer quadrant of left breast of female, estrogen receptor negative ([facility])  C50.512    Z17.1     SUMMARY OF ONCOLOGIC HISTORY: Oncology History  Malignant neoplasm of lower-outer quadrant of left breast of female, estrogen receptor negative ([facility])  2003 Initial Biopsy   Right breast cancer diagnosed and [facility] at [facility] hormone receptor positive, 30 positive axillary lymph nodes modified radical mastectomy followed by chemotherapy, did not take radiation on antiestrogen therapy   08/29/2017 Initial Diagnosis   Left breast 5:00 position: 2.7 cm mass, left axillary LN 3.5 cm, adjacent lymph node 0.9 cm, third lymph node 1.1 cm; Biopsy IDC grade 3 with DCIS, lymph node biopsy positive for metastatic ductal carcinoma, ER 0%, PR 0%,HER-2 negative ratio 1.28, stage IIIB AJCC 8   09/09/2017 - 01/02/2018 Neo-Adjuvant Chemotherapy   Gemcitabine/Carboplatin given on day 1 and day 8 of a 21 day cycle   03/03/2018 Surgery   Left mastectomy: Poorly differentiated IDC 3.5 cm and 4.1 cm, margins negative, 2/16 lymph nodes positive, cancer invades perinodal tissue, 40% of the tumor tissue appears necrotic.     CHIEF COMPLIANT: Surveillance of breast cancer  INTERVAL HISTORY: Suzanne May is a 60 y.o. with above-mentioned history of right breast cancer who developedtriple negativeleft breast cancer andreceived neoadjuvant chemotherapy and a left mastectomy. She presents to the clinic today for follow-up.   She reports intermittent pain when she leans forward especially in the right breast expander.  It feels hard.  She would like to switch to an implant but because of financial reasons she is elected not to do it.  ALLERGIES:  is allergic to penicillins, olive oil, and sulfa  antibiotics.  MEDICATIONS:  No current outpatient medications on file.   No current facility-administered medications for this visit.    PHYSICAL EXAMINATION: ECOG PERFORMANCE STATUS: 1 - Symptomatic but completely ambulatory  Vitals:   12/29/20 1423  BP: 126/84  Pulse: 67  Resp: 18  Temp: 97.7 F (36.5 C)  SpO2: 100%   Filed Weights   12/29/20 1423  Weight: 176 lb (79.8 kg)      LABORATORY DATA:  I have reviewed the data as listed CMP Latest Ref Rng & Units 05/22/2018 12/23/2017 12/09/2017  Glucose 70 - 99 mg/dL 84 74 95  BUN 6 - 20 mg/dL 7 8 8   Creatinine 0.44 - 1.00 mg/dL 8.11 9.14 7.82  Sodium 135 - 145 mmol/L 138 140 139  Potassium 3.5 - 5.1 mmol/L 3.4(L) 3.3(L) 3.1(L)  Chloride 98 - 111 mmol/L 105 106 103  CO2 22 - 32 mmol/L 27 25 24   Calcium 8.9 - 10.3 mg/dL 9.9 8.9 9.3  Total Protein 6.5 - 8.1 g/dL 7.2 7.0 7.6  Total Bilirubin 0.3 - 1.2 mg/dL 0.6 0.8 9.5(A)  Alkaline Phos 38 - 126 U/L 78 76 84  AST 15 - 41 U/L 16 43(H) 64(H)  ALT 0 - 44 U/L 11 46 70(H)    Lab Results  Component Value Date   WBC 7.4 05/22/2018   HGB 11.8 05/22/2018   HCT 36.6 05/22/2018   MCV 86.5 05/22/2018   PLT 179 05/22/2018   NEUTROABS 3.9 05/22/2018    ASSESSMENT & PLAN:  Malignant neoplasm of lower-outer quadrant of left breast of female, estrogen receptor negative ([facility]) 08/29/2017: Left breast 5:00 position: 2.7 cm  mass, left axillary LN 3.5 cm, adjacent lymph node 0.9 cm, third lymph node 1.1 cm; Biopsy IDC grade 3 with DCIS, 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  02/27/2018:Left mastectomy: Poorly differentiated IDC 3.5 cm and 4.1 cm, margins negative, 2/16 lymph nodes positive, cancer invades perinodal tissue, 40% of the tumor tissue appears necrotic.  Treatment summary: 1. Neoadjuvant chemotherapy with Gemcitabine and Carboplatinx 6 cycles completed 01/02/18(chosen due to prior Adriamycin treatment and Taxol  related severe neuropathy) 2.left mastectomy 02/27/2018 3.Patient refusedradiation 4. Breast reconstructionled to multiple complications including what appears to be skin necrosis that required removal of the expander.  ------------------------------------------------------------------------------------------------------------------------------ Breast cancer surveillance: 1.  Breast exam: Benign right mastectomy status post expander placement December 2019. Left mastectomy without any palpable lumps or nodules and no reconstruction.  Because of financial reasons she was unable to get the expander changed to an implant. Return to clinic in 1 year for follow-up    No orders of the defined types were placed in this encounter.  The patient has a good understanding of the overall plan. she agrees with it. she will call with any problems that may develop before the next visit here.  Total time spent: 20 mins including face to face time and time spent for planning, charting and coordination of care  Sabas Sous, MD, MPH 12/29/2020  I, Kirt Boys Dorshimer, am acting as scribe for Dr. Serena Croissant.  I have reviewed the above documentation for accuracy and completeness, and I agree with the above.

## 2020-12-29 ENCOUNTER — Other Ambulatory Visit: Payer: Self-pay

## 2020-12-29 ENCOUNTER — Ambulatory Visit: Payer: BC Managed Care – PPO | Admitting: Hematology and Oncology

## 2020-12-29 ENCOUNTER — Inpatient Hospital Stay: Payer: BC Managed Care – PPO | Attending: Hematology and Oncology | Admitting: Hematology and Oncology

## 2020-12-29 DIAGNOSIS — Z171 Estrogen receptor negative status [ER-]: Secondary | ICD-10-CM | POA: Diagnosis not present

## 2020-12-29 DIAGNOSIS — Z853 Personal history of malignant neoplasm of breast: Secondary | ICD-10-CM | POA: Insufficient documentation

## 2020-12-29 DIAGNOSIS — C50512 Malignant neoplasm of lower-outer quadrant of left female breast: Secondary | ICD-10-CM

## 2021-01-12 ENCOUNTER — Telehealth: Payer: Self-pay | Admitting: *Deleted

## 2021-01-12 NOTE — Telephone Encounter (Signed)
Suzanne May states she feels something under her right arm this weekend. She had a mastectomy in 2019, saw Dr Lindi Adie earlier this month.     Dr Jana Hakim recommended bringing her in to see Sandi Mealy.  Message to scheduler for Thursday @ 1330

## 2021-01-12 NOTE — Telephone Encounter (Signed)
I am not here on Thursday. My schedule is blocked.  Lucianne Lei

## 2021-01-14 NOTE — Progress Notes (Incomplete)
Patient Care Team: Jonathon Jordan, MD as PCP - General (Family Medicine) Stark Klein, MD as Consulting Physician (General Surgery)  DIAGNOSIS: No diagnosis found.  SUMMARY OF ONCOLOGIC HISTORY: Oncology History  Malignant neoplasm of lower-outer quadrant of left breast of female, estrogen receptor negative ([place])  2003 Initial Biopsy   Right breast cancer diagnosed and treated at [facility] hormone receptor positive, 30 positive axillary lymph nodes modified radical mastectomy followed by chemotherapy, did not take radiation on antiestrogen therapy   08/29/2017 Initial Diagnosis   Left breast 5:00 position: 2.7 cm mass, left axillary LN 3.5 cm, adjacent lymph node 0.9 cm, third lymph node 1.1 cm; Biopsy IDC grade 3 with DCIS, lymph node biopsy positive for metastatic ductal carcinoma, ER 0%, PR 0%,HER-2 negative ratio 1.28, stage IIIB AJCC 8   09/09/2017 - 01/02/2018 Neo-Adjuvant Chemotherapy   Gemcitabine/Carboplatin given on day 1 and day 8 of a 21 day cycle   03/03/2018 Surgery   Left mastectomy: Poorly differentiated IDC 3.5 cm and 4.1 cm, margins negative, 2/16 lymph nodes positive, cancer invades perinodal tissue, 40% of the tumor tissue appears necrotic.     CHIEF COMPLIANT: Surveillance of breast cancer  INTERVAL HISTORY: Suzanne May is a 60 y.o. with above-mentioned history of right breast cancer who developedtriple negativeleft breast cancer andreceived neoadjuvant chemotherapy and a left mastectomy.She presents to the clinic todayfor follow-up for a mass in the right axilla.   ALLERGIES:  is allergic to penicillins, olive oil, and sulfa antibiotics.  MEDICATIONS:  No current outpatient medications on file.   No current facility-administered medications for this visit.    PHYSICAL EXAMINATION: ECOG PERFORMANCE STATUS: {CHL ONC ECOG [id number]}  There were no vitals filed for this visit. There were no vitals filed for this visit.  BREAST:*** No  palpable masses or nodules in either right or left breasts. No palpable axillary supraclavicular or infraclavicular adenopathy no breast tenderness or nipple discharge. (exam performed in the presence of a chaperone)  LABORATORY DATA:  I have reviewed the data as listed CMP Latest Ref Rng & Units 05/22/2018 12/23/2017 12/09/2017  Glucose 70 - 99 mg/dL 84 74 95  BUN 6 - 20 mg/dL _0 Creatinine 0.44 - 1.00 mg/dL 0.76 0.81 0.88  Sodium 135 - 145 mmol/L 138 140 139  Potassium 3.5 - 5.1 mmol/L 3.4(L) 3.3(L) 3.1(L)  Chloride 98 - 111 mmol/L 105 106 103  CO2 22 - 32 mmol/L _1 Calcium 8.9 - 10.3 mg/dL 9.9 8.9 9.3  Total Protein 6.5 - 8.1 g/dL 7.2 7.0 7.6  Total Bilirubin 0.3 - 1.2 mg/dL 0.6 0.8 1.3(H)  Alkaline Phos 38 - 126 U/L 78 76 84  AST 15 - 41 U/L 16 43(H) 64(H)  ALT 0 - 44 U/L 11 46 70(H)    Lab Results  Component Value Date   WBC 7.4 05/22/2018   HGB 11.8 05/22/2018   HCT 36.6 05/22/2018   MCV 86.5 05/22/2018   PLT 179 05/22/2018   NEUTROABS 3.9 05/22/2018    ASSESSMENT & PLAN:  No problem-specific Assessment & Plan notes found for this encounter.    No orders of the defined types were placed in this encounter.  The patient has a good understanding of the overall plan. she agrees with it. she will call with any problems that may develop before the next visit here.  Total time spent: *** mins including face to face time and time spent for planning, charting and coordination of care  Vinay  Loyal Gambler, MD, MPH 01/14/2021  I, Molly Dorshimer, am acting as scribe for Dr. Nicholas Lose.  {insert scribe attestation}

## 2021-01-15 ENCOUNTER — Ambulatory Visit: Payer: BC Managed Care – PPO | Admitting: Hematology and Oncology

## 2021-01-15 NOTE — Assessment & Plan Note (Deleted)
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  02/27/2018:Left mastectomy: Poorly differentiated IDC 3.5 cm and 4.1 cm, margins negative, 2/16 lymph nodes positive, cancer invades perinodal tissue, 40% of the tumor tissue appears necrotic.  Treatment summary: 1. Neoadjuvant chemotherapy with Gemcitabine and Carboplatinx 6 cycles completed 01/02/18(chosen due to prior Adriamycin treatment and Taxol related severe neuropathy) 2.left mastectomy 02/27/2018 3.Patient refusedradiation 4. Breast reconstructionled to multiple complications including what appears to be skin necrosis that required removal of the expander.  ------------------------------------------------------------------------------------------------------------------------------ Breast cancer surveillance: Breast exam: Benign right mastectomy status post expander placement December 2019. Left mastectomy    Right axillary palpable abnormality:  Because of financial reasons she was unable to get the expander changed to an implant. Return to clinic in 1 year for follow-up

## 2021-01-16 ENCOUNTER — Telehealth: Payer: Self-pay | Admitting: Hematology and Oncology

## 2021-01-16 NOTE — Telephone Encounter (Signed)
Called pt per 2/25 sch msg - no answer. Left message for pt to call back to schedule appt with Sandi Mealy

## 2021-01-27 ENCOUNTER — Other Ambulatory Visit: Payer: Self-pay

## 2021-01-27 ENCOUNTER — Telehealth: Payer: Self-pay

## 2021-01-27 DIAGNOSIS — C50512 Malignant neoplasm of lower-outer quadrant of left female breast: Secondary | ICD-10-CM

## 2021-01-27 NOTE — Telephone Encounter (Signed)
MD recommendations for US Breast Right Axilla.   Orders placed.   Patient updated, and verbalized understanding and agreement.  RN provided contact information for [facility].

## 2021-01-27 NOTE — Telephone Encounter (Signed)
Pt called to report new finding to right axillary.    Pt with history of right breast cancer; triple negative.  Bilateral breast mastectomy, left breast implant.    Patient denies any redness, streaking, drainage.  Pt states, "I don't want to speculate, but I've never felt anything like it."    Will review with MD for further recommendations.

## 2021-02-24 ENCOUNTER — Other Ambulatory Visit: Payer: Self-pay

## 2021-02-24 ENCOUNTER — Ambulatory Visit
Admission: RE | Admit: 2021-02-24 | Discharge: 2021-02-24 | Disposition: A | Discharge status: Home / Self Care | Payer: BC Managed Care – PPO | Source: Ambulatory Visit | Attending: Hematology and Oncology | Admitting: Hematology and Oncology

## 2021-02-24 DIAGNOSIS — C50512 Malignant neoplasm of lower-outer quadrant of left female breast: Secondary | ICD-10-CM

## 2021-09-08 IMAGING — US US AXILLARY RIGHT
1 series · 5 of 5 positions shown · non-contrast
Comparison: None.

CLINICAL DATA: Patient describes a palpable lump within the RIGHT
axilla. History of breast cancer status post bilateral mastectomy.

EXAM:
ULTRASOUND OF THE RIGHT AXILLA

[Series 1: us axillary right · 0.06mm/px · 5 of 5 slices shown]
[im 1/5]
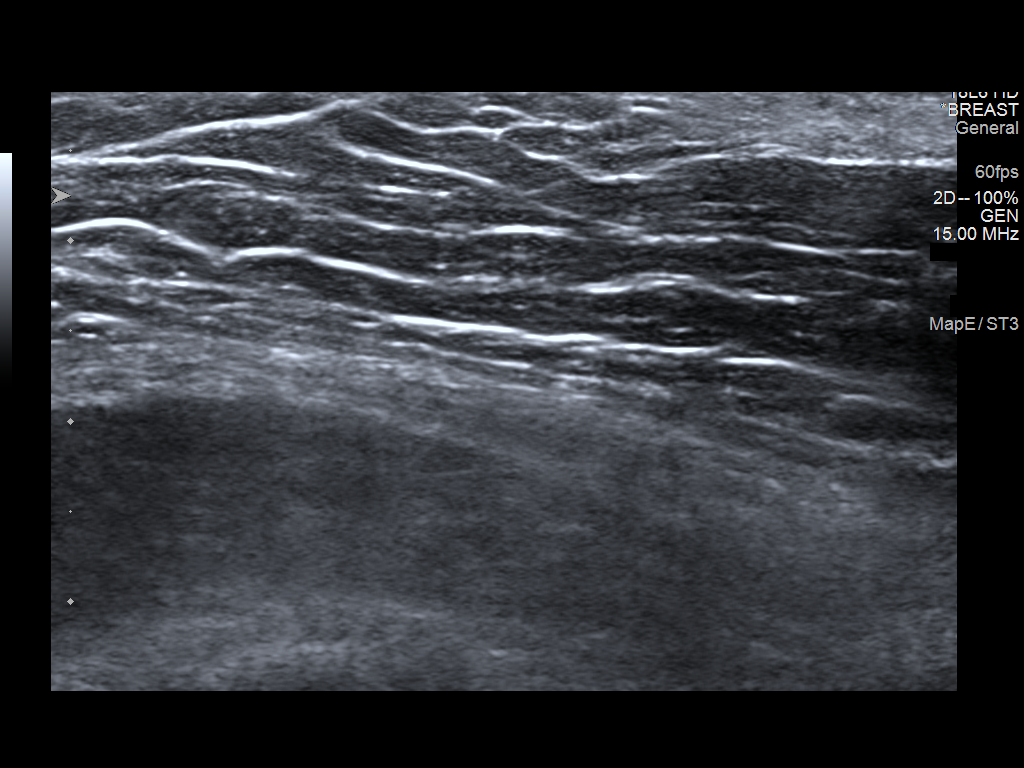
[im 2/5]
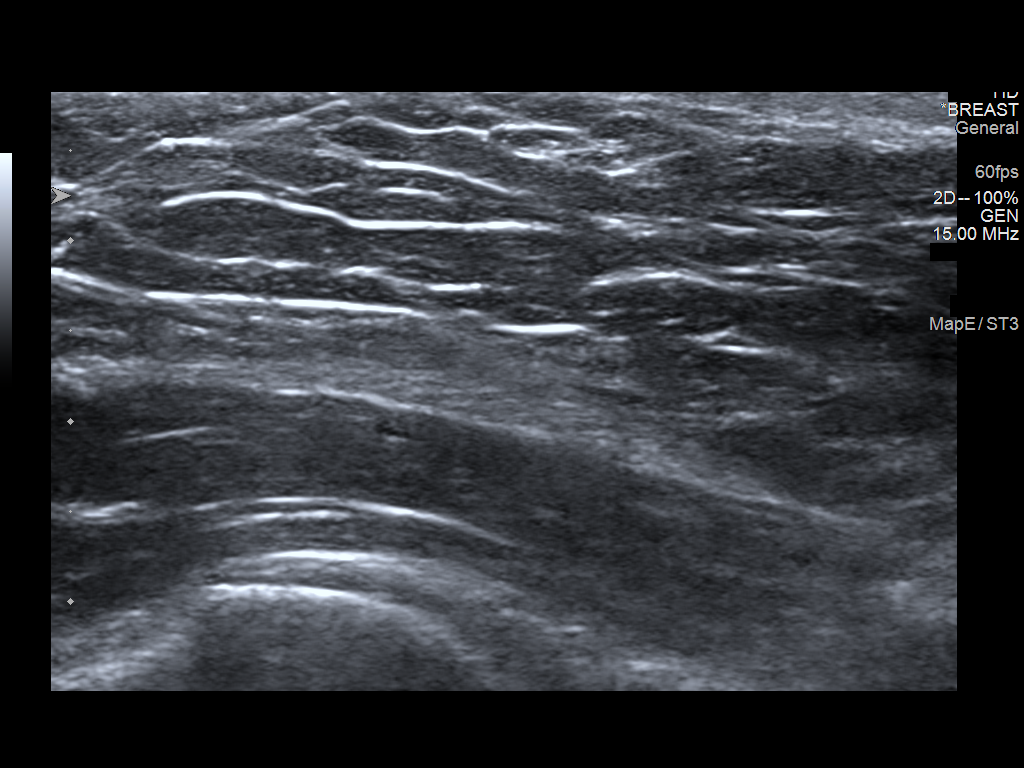
[im 3/5]
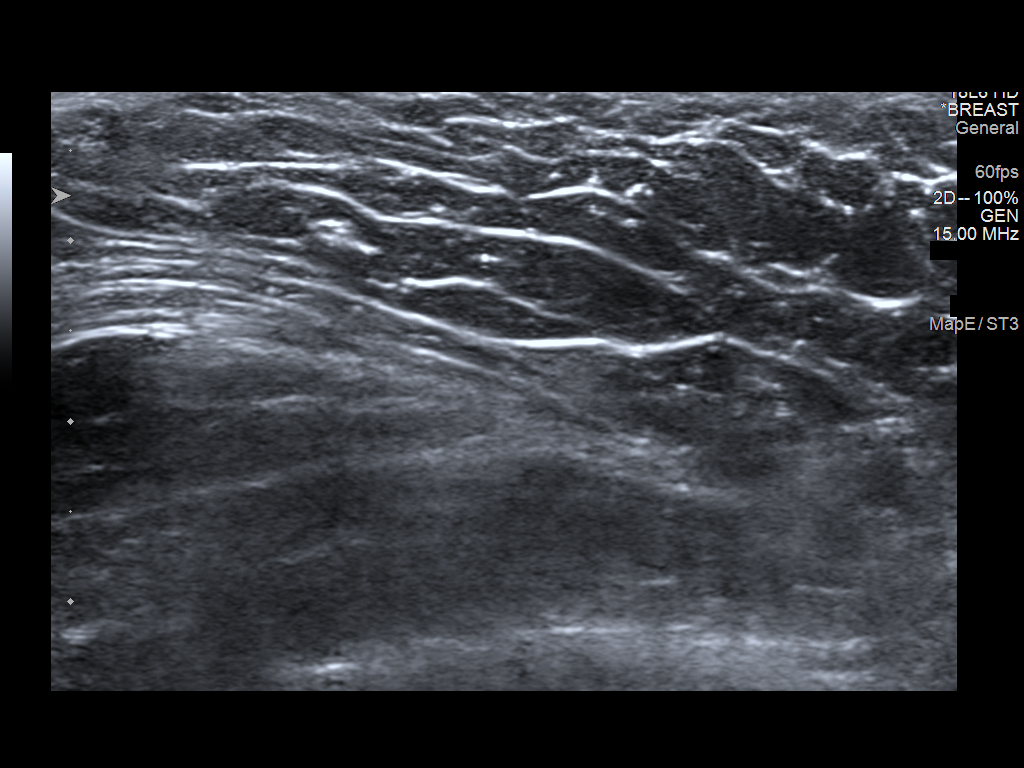
[im 4/5]
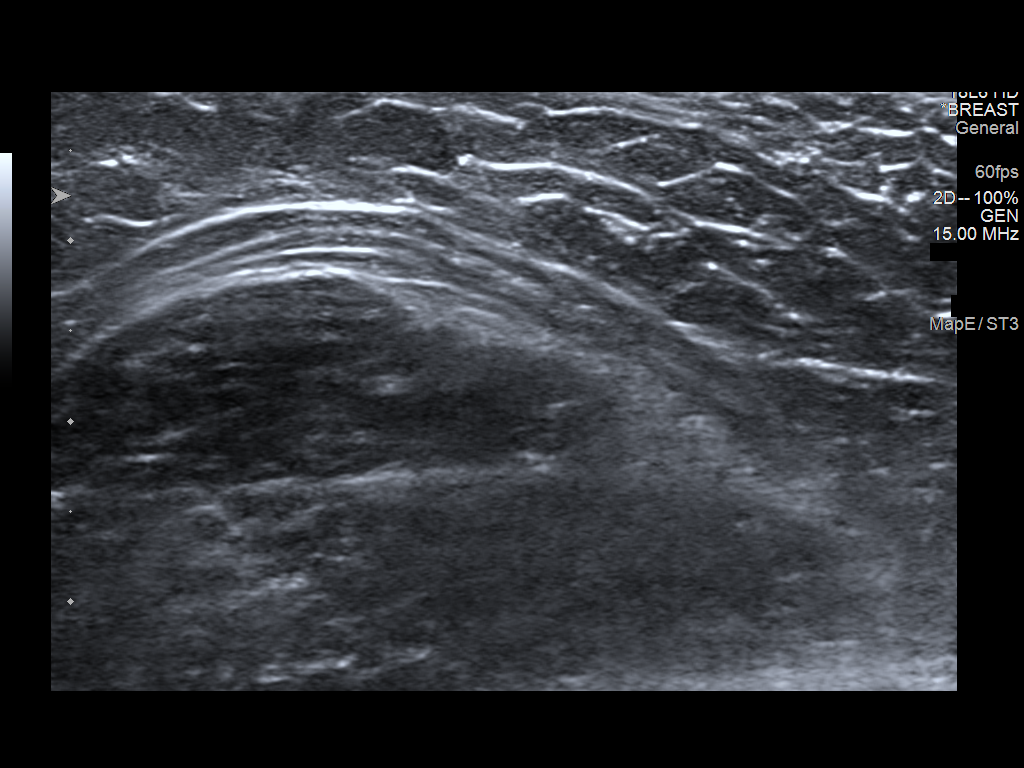
[im 5/5]
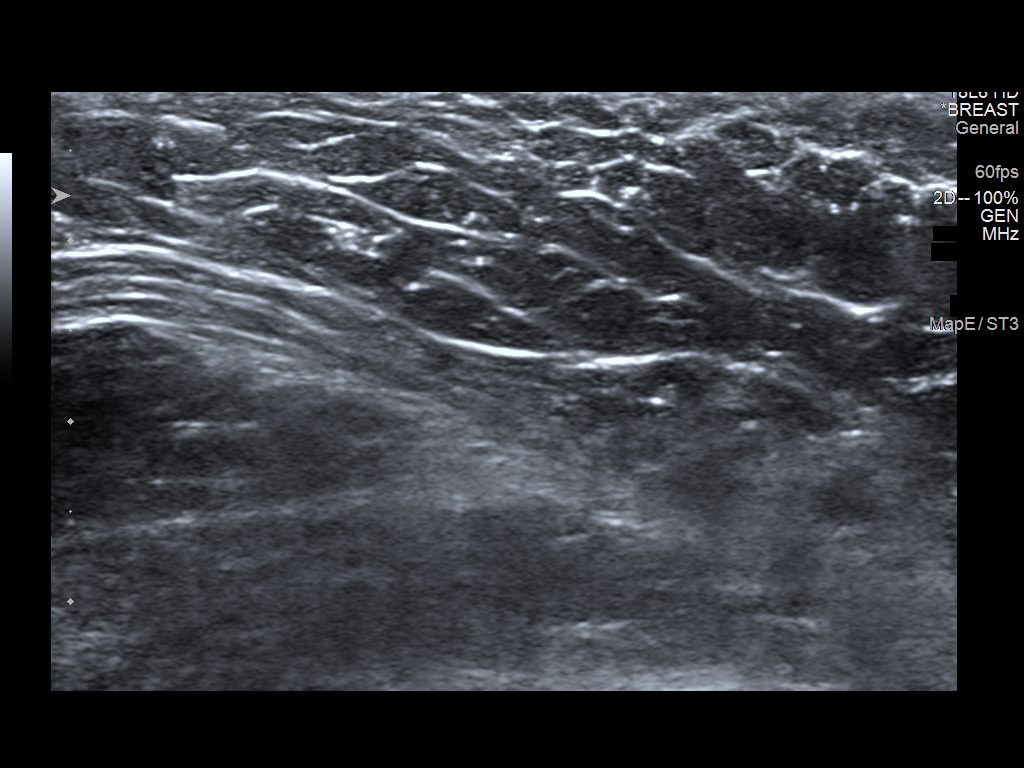

[5 of 5 positions shown; findings below may reference images not displayed]

FINDINGS: Ultrasound is performed, evaluating the RIGHT axilla as directed by
the patient, showing only normal soft tissues throughout. No solid
or cystic mass. No enlarged or morphologically abnormal lymph nodes.
IMPRESSION: Normal ultrasound evaluation of the RIGHT axilla.

RECOMMENDATION:
Clinical follow-up.

I have discussed the findings and recommendations with the patient.
If applicable, a reminder letter will be sent to the patient
regarding the next appointment.

BI-RADS CATEGORY  1: Negative.

## 2021-12-29 ENCOUNTER — Ambulatory Visit: Payer: BC Managed Care – PPO | Admitting: Hematology and Oncology

## 2022-01-04 NOTE — Progress Notes (Incomplete)
Patient Care Team: Jonathon Jordan, MD as PCP - General (Family Medicine) Stark Klein, MD as Consulting Physician (General Surgery)  DIAGNOSIS: No diagnosis found.  SUMMARY OF ONCOLOGIC HISTORY: Oncology History  Malignant neoplasm of lower-outer quadrant of left breast of female, estrogen receptor negative ([place])  2003 Initial Biopsy   Right breast cancer diagnosed and treated at [facility] hormone receptor positive, 30 positive axillary lymph nodes modified radical mastectomy followed by chemotherapy, did not take radiation on antiestrogen therapy   08/29/2017 Initial Diagnosis   Left breast 5:00 position: 2.7 cm mass, left axillary LN 3.5 cm, adjacent lymph node 0.9 cm, third lymph node 1.1 cm; Biopsy IDC grade 3 with DCIS, lymph node biopsy positive for metastatic ductal carcinoma, ER 0%, PR 0%,HER-2 negative ratio 1.28, stage IIIB AJCC 8   09/09/2017 - 01/02/2018 Neo-Adjuvant Chemotherapy   Gemcitabine/Carboplatin given on day 1 and day 8 of a 21 day cycle   03/03/2018 Surgery   Left mastectomy: Poorly differentiated IDC 3.5 cm and 4.1 cm, margins negative, 2/16 lymph nodes positive, cancer invades perinodal tissue, 40% of the tumor tissue appears necrotic.     CHIEF COMPLIANT: Follow-up of right breast cancer  INTERVAL HISTORY: Suzanne May is a 61 y.o. with above-mentioned history of right breast cancer who developed triple negative left breast cancer and received neoadjuvant chemotherapy and a left mastectomy. She presents to the clinic today for follow-up.   ALLERGIES:  is allergic to penicillins, olive oil, and sulfa antibiotics.  MEDICATIONS:  No current outpatient medications on file.   No current facility-administered medications for this visit.    PHYSICAL EXAMINATION: ECOG PERFORMANCE STATUS: {CHL ONC ECOG [id number]}  There were no vitals filed for this visit. There were no vitals filed for this visit.  BREAST:*** No palpable masses or nodules in  either right or left breasts. No palpable axillary supraclavicular or infraclavicular adenopathy no breast tenderness or nipple discharge. (exam performed in the presence of a chaperone)  LABORATORY DATA:  I have reviewed the data as listed CMP Latest Ref Rng & Units 05/22/2018 12/23/2017 12/09/2017  Glucose 70 - 99 mg/dL 84 74 95  BUN 6 - 20 mg/dL _0 Creatinine 0.44 - 1.00 mg/dL 0.76 0.81 0.88  Sodium 135 - 145 mmol/L 138 140 139  Potassium 3.5 - 5.1 mmol/L 3.4(L) 3.3(L) 3.1(L)  Chloride 98 - 111 mmol/L 105 106 103  CO2 22 - 32 mmol/L _1 Calcium 8.9 - 10.3 mg/dL 9.9 8.9 9.3  Total Protein 6.5 - 8.1 g/dL 7.2 7.0 7.6  Total Bilirubin 0.3 - 1.2 mg/dL 0.6 0.8 1.3(H)  Alkaline Phos 38 - 126 U/L 78 76 84  AST 15 - 41 U/L 16 43(H) 64(H)  ALT 0 - 44 U/L 11 46 70(H)    Lab Results  Component Value Date   WBC 7.4 05/22/2018   HGB 11.8 05/22/2018   HCT 36.6 05/22/2018   MCV 86.5 05/22/2018   PLT 179 05/22/2018   NEUTROABS 3.9 05/22/2018    ASSESSMENT & PLAN:  No problem-specific Assessment & Plan notes found for this encounter.    No orders of the defined types were placed in this encounter.  The patient has a good understanding of the overall plan. she agrees with it. she will call with any problems that may develop before the next visit here.  Total time spent: *** mins including face to face time and time spent for planning, charting and coordination of care  Vinay K  Lindi Adie, MD, MPH 01/04/2022  I, Thana Ates, am acting as scribe for Dr. Nicholas Lose.  {insert scribe attestation}

## 2022-01-05 ENCOUNTER — Inpatient Hospital Stay: Payer: BC Managed Care – PPO | Attending: Hematology and Oncology | Admitting: Hematology and Oncology

## 2022-01-05 NOTE — Assessment & Plan Note (Signed)
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  02/27/2018:Left mastectomy: Poorly differentiated IDC 3.5 cm and 4.1 cm, margins negative, 2/16 lymph nodes positive, cancer invades perinodal tissue, 40% of the tumor tissue appears necrotic.  Treatment summary: 1. Neoadjuvant chemotherapy with Gemcitabine and Carboplatinx 6 cycles completed 01/02/18(chosen due to prior Adriamycin treatment and Taxol related severe neuropathy) 2.left mastectomy 02/27/2018 3.Patient refusedradiation 4. Breast reconstructionled to multiple complications including what appears to be skin necrosis that required removal of the expander.  ------------------------------------------------------------------------------------------------------------------------------ Breast cancer surveillance: 1.  Breast exam 01/05/2022: Benign right mastectomy status post expander placement December 2019. Left mastectomy without any palpable lumps or nodules and no reconstruction.  Because of financial reasons she was unable to get the expander changed to an implant. Return to clinic in 1 year for follow-up

## 2024-07-04 ENCOUNTER — Ambulatory Visit: Admitting: Plastic Surgery

## 2024-07-04 ENCOUNTER — Encounter: Payer: Self-pay | Admitting: Plastic Surgery

## 2024-07-04 VITALS — BP 121/83 | HR 71 | Ht 67.0 in | Wt 155.8 lb

## 2024-07-04 DIAGNOSIS — C50512 Malignant neoplasm of lower-outer quadrant of left female breast: Secondary | ICD-10-CM

## 2024-07-04 DIAGNOSIS — Z859 Personal history of malignant neoplasm, unspecified: Secondary | ICD-10-CM | POA: Diagnosis not present

## 2024-07-04 DIAGNOSIS — Z9013 Acquired absence of bilateral breasts and nipples: Secondary | ICD-10-CM | POA: Diagnosis not present

## 2024-07-04 DIAGNOSIS — Z171 Estrogen receptor negative status [ER-]: Secondary | ICD-10-CM

## 2024-07-04 NOTE — Progress Notes (Signed)
 Referring Provider Verena Mems, MD [address] [place],  [place] [address]   CC:  Chief Complaint  Patient presents with   Consult      Suzanne May May is an 63 y.o. female.  HPI: Suzanne May May is a 63 year old female who presents today for discussion of breast May after mastectomy.  She has a long and complicated history of breast surgeries.  She underwent a breast reduction many years ago.  She developed a right breast cancer that required a mastectomy.  She subsequently developed a right breast cancer that required mastectomy.  She also developed a left breast cancer that also required mastectomy.  She underwent placement of tissue expanders with loss of the left tissue expander due to exposure.  She currently has a right tissue expander in place with no May on the left side.  She is interested in what can be done for the left breast.  She has not had radiation on the left.  She has had an abdominoplasty.  She is a non-smoker.  Allergies  Allergen Reactions   Penicillins Anaphylaxis and Hives    Has patient had a PCN reaction causing immediate rash, facial/tongue/throat swelling, SOB or lightheadedness with hypotension: Yes Has patient had a PCN reaction causing severe rash involving mucus membranes or skin necrosis: Yes Has patient had a PCN reaction that required hospitalization No Has patient had a PCN reaction occurring within the last 10 years: No If all of the above answers are NO, then may proceed with Cephalosporin use.\   Olive Oil Hives    No Olive oil; skin (only)   Sulfa Antibiotics Rash    No outpatient encounter medications on file as of 07/04/2024.   No facility-administered encounter medications on file as of 07/04/2024.     Past Medical History:  Diagnosis Date   Breast cancer (HCC)    Breast cancer   Headache    Personal history of chemotherapy    PONV (postoperative nausea and vomiting)     Tuberculosis    Haven't taken medication since 04/2017    Past Surgical History:  Procedure Laterality Date   AUGMENTATION MAMMAPLASTY Left    left and right   IR ANGIO EXTRACRAN SEL COM CAROTID INNOMINATE UNI L MOD SED  05/30/2017   IR ANGIO INTRA EXTRACRAN SEL COM CAROTID INNOMINATE UNI R MOD SED  05/30/2017   IR ANGIO VERTEBRAL SEL SUBCLAVIAN INNOMINATE BILAT MOD SED  05/30/2017   IR ANGIOGRAM EXTREMITY LEFT  05/30/2017   IR ANGIOGRAM SELECTIVE EACH ADDITIONAL VESSEL  05/30/2017   IR EMBO ART  VEN HEMORR LYMPH EXTRAV  INC GUIDE ROADMAPPING  05/30/2017   MASTECTOMY Right 2013   PORTACATH PLACEMENT Left 09/08/2017   Procedure: INSERTION PORT-A-CATH ERAS PATHWAY;  Surgeon: Aron Shoulders, MD;  Location: [facility] OR;  Service: General;  Laterality: Left;   RADIOLOGY WITH ANESTHESIA N/A 05/30/2017   Procedure: RADIOLOGY WITH ANESTHESIA;  Surgeon: Dolphus Carrion, MD;  Location: [facility] OR;  Service: Radiology;  Laterality: N/A;   TONSILLECTOMY      Family History  Problem Relation Age of Onset   Breast cancer Mother    Breast cancer Maternal Grandmother    Breast cancer Cousin    Diabetes Father     Social History   Social History Narrative   Not on file     Review of Systems General: Denies fevers, chills, weight loss CV: Denies chest pain, shortness of breath, palpitations Breast: Both breasts surgically absent, right tissue  expander.  No pain no complaints  Physical Exam    07/04/2024    2:49 PM 12/29/2020    2:23 PM 06/26/2020   10:10 AM  Vitals with BMI  Height 5' 7 5' 7 5' 7  Weight 155 lbs 13 oz 176 lbs 174 lbs 13 oz  BMI 24.4 27.56 27.37  Systolic 121 126 887  Diastolic 83 84 93  Pulse 71 67 81    General:  No acute distress,  Alert and oriented, Non-Toxic, Normal speech and affect Breast: As noted the breasts are surgically absent with a right tissue expander in place.  The tissue on the left chest is quite thin with a very thin pectoralis muscle.  The right breast has acceptable  shape and size. Mammogram: Not applicable Assessment/Plan Bilateral breast cancer, right tissue expander: I had a long discussion with Suzanne May. Suzanne May May.  She was interested in a total May using fat grafting.  I explained to her that this would be extremely difficult given the paucity of fat that she has but even if she did have sufficient fat there is no space for May.  Any fat that was placed in the left breast would extrude into the axilla and above the clavicle.  It would be  virtually impossible to recreated breast mound.  She also is not a candidate for autologous May with a diep flap.  We discussed the option of a pedicled latissimus.  She is bothered by the possibility of a scar on her back.  She has also had surgery in the axilla potentially damaging the blood supply.  She may be a candidate for a gluteal artery perforator flap but would need an implant as well.  She understands that this is not offered in [place].  She has recently started a new job and would like to wait until next summer before she considers going to a center where this procedure can be performed.  She will return to see me in March at which time I will make a referral for evaluation for breast May.  All questions were answered to her satisfaction.   I spent 45 minutes reviewing the patient's chart examining the patient discussing treatment options and documenting  Suzanne May May 07/04/2024, 4:39 PM

## 2025-01-31 ENCOUNTER — Ambulatory Visit: Admitting: Plastic Surgery
